# Patient Record
Sex: Female | Born: 1965 | Race: Black or African American | Hispanic: No | Marital: Single | Smoking: Never smoker
Health system: Southern US, Community
[De-identification: ages and names within clinical notes are randomized; demographics above are authoritative.]

## PROBLEM LIST (undated history)

## (undated) DIAGNOSIS — I10 Essential (primary) hypertension: Secondary | ICD-10-CM

## (undated) DIAGNOSIS — M109 Gout, unspecified: Secondary | ICD-10-CM

## (undated) DIAGNOSIS — M1990 Unspecified osteoarthritis, unspecified site: Secondary | ICD-10-CM

## (undated) DIAGNOSIS — E119 Type 2 diabetes mellitus without complications: Secondary | ICD-10-CM

## (undated) HISTORY — PX: SHOULDER SURGERY: SHX246

## (undated) HISTORY — DX: Gout, unspecified: M10.9

## (undated) HISTORY — PX: TOTAL ABDOMINAL HYSTERECTOMY: SHX209

## (undated) HISTORY — DX: Unspecified osteoarthritis, unspecified site: M19.90

---

## 2008-08-07 ENCOUNTER — Emergency Department (HOSPITAL_COMMUNITY): Admission: EM | Admit: 2008-08-07 | Discharge: 2008-08-07 | Payer: Self-pay | Admitting: Emergency Medicine

## 2015-05-13 DIAGNOSIS — D473 Essential (hemorrhagic) thrombocythemia: Secondary | ICD-10-CM

## 2015-05-13 DIAGNOSIS — D508 Other iron deficiency anemias: Secondary | ICD-10-CM

## 2015-06-14 ENCOUNTER — Emergency Department (HOSPITAL_COMMUNITY)
Admission: EM | Admit: 2015-06-14 | Discharge: 2015-06-14 | Disposition: A | Discharge status: Home / Self Care | Payer: Self-pay | Attending: Emergency Medicine | Admitting: Emergency Medicine

## 2015-06-14 ENCOUNTER — Emergency Department (HOSPITAL_COMMUNITY): Payer: Self-pay

## 2015-06-14 ENCOUNTER — Encounter (HOSPITAL_COMMUNITY): Payer: Self-pay | Admitting: Emergency Medicine

## 2015-06-14 DIAGNOSIS — Z794 Long term (current) use of insulin: Secondary | ICD-10-CM | POA: Insufficient documentation

## 2015-06-14 DIAGNOSIS — E119 Type 2 diabetes mellitus without complications: Secondary | ICD-10-CM | POA: Insufficient documentation

## 2015-06-14 DIAGNOSIS — L03115 Cellulitis of right lower limb: Secondary | ICD-10-CM | POA: Insufficient documentation

## 2015-06-14 DIAGNOSIS — L03031 Cellulitis of right toe: Secondary | ICD-10-CM

## 2015-06-14 DIAGNOSIS — I10 Essential (primary) hypertension: Secondary | ICD-10-CM | POA: Insufficient documentation

## 2015-06-14 DIAGNOSIS — Z91040 Latex allergy status: Secondary | ICD-10-CM | POA: Insufficient documentation

## 2015-06-14 DIAGNOSIS — M79675 Pain in left toe(s): Secondary | ICD-10-CM

## 2015-06-14 HISTORY — DX: Type 2 diabetes mellitus without complications: E11.9

## 2015-06-14 HISTORY — DX: Essential (primary) hypertension: I10

## 2015-06-14 MED ORDER — ACETAMINOPHEN 325 MG PO TABS
325.0000 mg | ORAL_TABLET | Freq: Once | ORAL | Status: AC
  Administered 2015-06-14: 325 mg via ORAL
  Filled 2015-06-14: qty 1

## 2015-06-14 MED ORDER — CEPHALEXIN 500 MG PO CAPS: 500.0000 mg | ORAL_CAPSULE | Freq: Four times a day (QID) | ORAL | Status: DC

## 2015-06-14 NOTE — ED Notes (Signed)
Pt here with hx of cellulitis on abd and still has draining area wants evaluated; pt c/o left foot pain with some swelling today

## 2015-06-14 NOTE — ED Notes (Signed)
Patient verbalized understanding of discharge instructions and denies any further needs or questions at this time. VS stable. Patient ambulatory with steady gait.  

## 2015-06-14 NOTE — Discharge Instructions (Signed)

## 2015-06-14 NOTE — ED Provider Notes (Signed)
CSN: [id number]     Arrival date & time 06/14/15  1602 History   First MD Initiated Contact with Patient 06/14/15 2217     Chief Complaint  Patient presents with  . Wound Infection    HPI   50 year old female presents today with complaints of redness to her left foot. Patient reports that she has a significant past medical history of cellulitis with a healing wound to her left abdomen. She reports that she is being followed by wound management for this, was seen by them yesterday and has a is a with them scheduled for tomorrow. She denies any active cellulitis to that site.  Patient reports that she woke up this morning and noticed an area of redness to her left foot at the second MTP. She reports that this is painful to palpation, painful with flexion extension of the toes and painful with ambulation. She denies any warmth to touch, fluctuance. She does have a history of gout on the right foot, reports this is less severe than the gout. She denies any history of trauma to the foot. Denies fever, chills, nausea, vomiting, dizziness, or any other concerning signs or symptoms. Patient is not currently taking any antibiotics.  Past Medical History  Diagnosis Date  . Hypertension   . Diabetes mellitus without complication ([place])    History reviewed. No pertinent past surgical history. History reviewed. No pertinent family history. Social History  Substance Use Topics  . Smoking status: Never Smoker   . Smokeless tobacco: None  . Alcohol Use: No   OB History    No data available     Review of Systems  All other systems reviewed and are negative.   Allergies  Latex  Home Medications   Prior to Admission medications   Medication Sig Start Date End Date Taking? Authorizing Provider  insulin aspart (NOVOLOG) 100 UNIT/ML injection Inject 5-6 Units into the skin 3 (three) times daily before meals. Per sliding scale   Yes Historical Provider, MD  insulin detemir (LEVEMIR) 100 UNIT/ML  injection Inject 18 Units into the skin at bedtime.   Yes Historical Provider, MD  losartan-hydrochlorothiazide (HYZAAR) 100-25 MG tablet Take 1 tablet by mouth daily.   Yes Historical Provider, MD  cephALEXin (KEFLEX) 500 MG capsule Take 1 capsule (500 mg total) by mouth 4 (four) times daily. 06/14/15   Ilo Beamon, PA-C   BP 134/103 mmHg  Pulse 90  Temp(Src) 98.5 F (36.9 C) (Oral)  Resp 16  SpO2 98%   Physical Exam  Constitutional: She is oriented to person, place, and time. She appears well-developed and well-nourished.  HENT:  Head: Normocephalic and atraumatic.  Eyes: Conjunctivae are normal. Pupils are equal, round, and reactive to light. Right eye exhibits no discharge. Left eye exhibits no discharge. No scleral icterus.  Neck: Normal range of motion. No JVD present. No tracheal deviation present.  Pulmonary/Chest: Effort normal. No stridor.  Abdominal:  Healing wound to the left abdomen covered by a dressing. Removal of dressing showed open wound, no surrounding cellulitis. No tenderness to palpation of surrounding soft tissue  Neurological: She is alert and oriented to person, place, and time. Coordination normal.  Psychiatric: She has a normal mood and affect. Her behavior is normal. Judgment and thought content normal.  Nursing note and vitals reviewed.  No warmth to touch, full active range of motion of the joint him and no involvement of the plantar surface     ED Course  Procedures (including critical care time)  Labs Review Labs Reviewed - No data to display  Imaging Review Dg Foot Complete Left  06/14/2015  CLINICAL DATA:  Left foot pain and swelling beginning last night centered about the second and third metatarsals. No known injury. Initial encounter. EXAM: LEFT FOOT - COMPLETE 3+ VIEW COMPARISON:  None. FINDINGS: No acute bony or joint abnormality is identified. Joint spaces are maintained. No erosion or periostitis is seen. No soft tissue gas collection.  Small plantar calcaneal spur is noted. IMPRESSION: Negative exam. Electronically Signed   By: Inge Rise M.D.   On: 06/14/2015 17:02   I have personally reviewed and evaluated these images and lab results as part of my medical decision-making.   EKG Interpretation None      MDM   Final diagnoses:  Pain of toe of left foot  Cellulitis of toe of right foot    Labs:  Imaging:  Consults:  Therapeutics:  Discharge Meds:   Assessment/Plan: 50 year old female with a history of wound infection presents today with redness to her left foot. It is no warmth to touch, she has full range of motion of the toe but has pain. I have very low suspicion for a septic arthritis, this could be the beginning of cellulitic infection. Patient does have a history of gout, uncertain if this is the beginning of gout as this is less severe than her previous gout flares. In any event patient has a significant past medical history of infections that have resulted in chronic wounds, she is not currently taking antibiotics no antibiotics in the last month. She is afebrile, nontoxic with no signs of systemic involvement. She will be placed on Keflex, instructed to follow-up with her primary care provider tomorrow for reevaluation and further management. Patient is given strict return precautions, verbalized understanding and agreement for today's plan.          Okey Regal, PA-C 06/15/15 0149  Harvel Quale, MD 06/22/15 2130

## 2015-07-04 ENCOUNTER — Encounter (HOSPITAL_BASED_OUTPATIENT_CLINIC_OR_DEPARTMENT_OTHER): Payer: Self-pay | Attending: Internal Medicine

## 2015-07-04 DIAGNOSIS — L98491 Non-pressure chronic ulcer of skin of other sites limited to breakdown of skin: Secondary | ICD-10-CM | POA: Insufficient documentation

## 2015-07-04 DIAGNOSIS — M109 Gout, unspecified: Secondary | ICD-10-CM | POA: Insufficient documentation

## 2015-07-04 DIAGNOSIS — E119 Type 2 diabetes mellitus without complications: Secondary | ICD-10-CM | POA: Insufficient documentation

## 2015-07-04 DIAGNOSIS — T8131XA Disruption of external operation (surgical) wound, not elsewhere classified, initial encounter: Secondary | ICD-10-CM | POA: Insufficient documentation

## 2015-07-04 DIAGNOSIS — Y838 Other surgical procedures as the cause of abnormal reaction of the patient, or of later complication, without mention of misadventure at the time of the procedure: Secondary | ICD-10-CM | POA: Insufficient documentation

## 2015-07-04 DIAGNOSIS — L02211 Cutaneous abscess of abdominal wall: Secondary | ICD-10-CM | POA: Insufficient documentation

## 2015-07-04 DIAGNOSIS — Z794 Long term (current) use of insulin: Secondary | ICD-10-CM | POA: Insufficient documentation

## 2015-07-11 ENCOUNTER — Other Ambulatory Visit: Payer: Self-pay | Admitting: Internal Medicine

## 2015-07-11 DIAGNOSIS — L98499 Non-pressure chronic ulcer of skin of other sites with unspecified severity: Secondary | ICD-10-CM

## 2015-07-11 DIAGNOSIS — L02211 Cutaneous abscess of abdominal wall: Secondary | ICD-10-CM

## 2015-07-11 DIAGNOSIS — T8131XS Disruption of external operation (surgical) wound, not elsewhere classified, sequela: Secondary | ICD-10-CM

## 2015-07-19 ENCOUNTER — Encounter (HOSPITAL_COMMUNITY): Payer: Self-pay

## 2015-07-19 ENCOUNTER — Ambulatory Visit (HOSPITAL_COMMUNITY)
Admission: RE | Admit: 2015-07-19 | Discharge: 2015-07-19 | Disposition: A | Discharge status: Home / Self Care | Payer: Self-pay | Source: Ambulatory Visit | Attending: Internal Medicine | Admitting: Internal Medicine

## 2015-07-19 ENCOUNTER — Other Ambulatory Visit: Payer: Self-pay | Admitting: Internal Medicine

## 2015-07-19 DIAGNOSIS — L98499 Non-pressure chronic ulcer of skin of other sites with unspecified severity: Secondary | ICD-10-CM

## 2015-07-19 DIAGNOSIS — S31109A Unspecified open wound of abdominal wall, unspecified quadrant without penetration into peritoneal cavity, initial encounter: Secondary | ICD-10-CM | POA: Insufficient documentation

## 2015-07-19 DIAGNOSIS — L02211 Cutaneous abscess of abdominal wall: Secondary | ICD-10-CM

## 2015-07-19 DIAGNOSIS — T8131XS Disruption of external operation (surgical) wound, not elsewhere classified, sequela: Secondary | ICD-10-CM

## 2015-07-19 MED ORDER — IOHEXOL 300 MG/ML  SOLN: 100.0000 mL | Freq: Once | INTRAMUSCULAR | Status: DC | PRN

## 2015-08-02 ENCOUNTER — Other Ambulatory Visit: Payer: Self-pay | Admitting: Surgery

## 2015-08-04 ENCOUNTER — Encounter (HOSPITAL_BASED_OUTPATIENT_CLINIC_OR_DEPARTMENT_OTHER): Payer: Self-pay | Attending: Internal Medicine

## 2015-08-04 DIAGNOSIS — Z794 Long term (current) use of insulin: Secondary | ICD-10-CM | POA: Insufficient documentation

## 2015-08-04 DIAGNOSIS — I10 Essential (primary) hypertension: Secondary | ICD-10-CM | POA: Insufficient documentation

## 2015-08-04 DIAGNOSIS — T8189XA Other complications of procedures, not elsewhere classified, initial encounter: Secondary | ICD-10-CM | POA: Insufficient documentation

## 2015-08-04 DIAGNOSIS — M109 Gout, unspecified: Secondary | ICD-10-CM | POA: Insufficient documentation

## 2015-08-04 DIAGNOSIS — Y838 Other surgical procedures as the cause of abnormal reaction of the patient, or of later complication, without mention of misadventure at the time of the procedure: Secondary | ICD-10-CM | POA: Insufficient documentation

## 2015-08-04 DIAGNOSIS — E119 Type 2 diabetes mellitus without complications: Secondary | ICD-10-CM | POA: Insufficient documentation

## 2015-08-04 DIAGNOSIS — L98492 Non-pressure chronic ulcer of skin of other sites with fat layer exposed: Secondary | ICD-10-CM | POA: Insufficient documentation

## 2015-08-11 DIAGNOSIS — D473 Essential (hemorrhagic) thrombocythemia: Secondary | ICD-10-CM

## 2015-09-01 ENCOUNTER — Encounter (HOSPITAL_BASED_OUTPATIENT_CLINIC_OR_DEPARTMENT_OTHER): Payer: Self-pay | Attending: Internal Medicine

## 2015-09-01 DIAGNOSIS — S31104A Unspecified open wound of abdominal wall, left lower quadrant without penetration into peritoneal cavity, initial encounter: Secondary | ICD-10-CM | POA: Insufficient documentation

## 2015-09-01 DIAGNOSIS — E119 Type 2 diabetes mellitus without complications: Secondary | ICD-10-CM | POA: Insufficient documentation

## 2015-09-01 DIAGNOSIS — I10 Essential (primary) hypertension: Secondary | ICD-10-CM | POA: Insufficient documentation

## 2015-09-01 DIAGNOSIS — Z794 Long term (current) use of insulin: Secondary | ICD-10-CM | POA: Insufficient documentation

## 2015-09-26 ENCOUNTER — Other Ambulatory Visit: Payer: Self-pay | Admitting: Surgery

## 2015-09-26 ENCOUNTER — Encounter (HOSPITAL_BASED_OUTPATIENT_CLINIC_OR_DEPARTMENT_OTHER): Payer: Self-pay | Attending: Internal Medicine

## 2015-09-26 DIAGNOSIS — E119 Type 2 diabetes mellitus without complications: Secondary | ICD-10-CM | POA: Insufficient documentation

## 2015-09-26 DIAGNOSIS — L98492 Non-pressure chronic ulcer of skin of other sites with fat layer exposed: Secondary | ICD-10-CM | POA: Insufficient documentation

## 2015-09-26 DIAGNOSIS — I10 Essential (primary) hypertension: Secondary | ICD-10-CM | POA: Insufficient documentation

## 2015-09-29 ENCOUNTER — Encounter (HOSPITAL_BASED_OUTPATIENT_CLINIC_OR_DEPARTMENT_OTHER): Payer: Self-pay

## 2015-10-27 ENCOUNTER — Encounter (HOSPITAL_BASED_OUTPATIENT_CLINIC_OR_DEPARTMENT_OTHER): Payer: Self-pay | Attending: Internal Medicine

## 2015-10-27 DIAGNOSIS — L02211 Cutaneous abscess of abdominal wall: Secondary | ICD-10-CM | POA: Insufficient documentation

## 2015-10-27 DIAGNOSIS — Z794 Long term (current) use of insulin: Secondary | ICD-10-CM | POA: Insufficient documentation

## 2015-10-27 DIAGNOSIS — R110 Nausea: Secondary | ICD-10-CM | POA: Insufficient documentation

## 2015-10-27 DIAGNOSIS — E119 Type 2 diabetes mellitus without complications: Secondary | ICD-10-CM | POA: Insufficient documentation

## 2015-10-27 DIAGNOSIS — T8131XA Disruption of external operation (surgical) wound, not elsewhere classified, initial encounter: Secondary | ICD-10-CM | POA: Insufficient documentation

## 2015-10-27 DIAGNOSIS — R51 Headache: Secondary | ICD-10-CM | POA: Insufficient documentation

## 2015-10-27 DIAGNOSIS — L98492 Non-pressure chronic ulcer of skin of other sites with fat layer exposed: Secondary | ICD-10-CM | POA: Insufficient documentation

## 2015-10-27 DIAGNOSIS — M109 Gout, unspecified: Secondary | ICD-10-CM | POA: Insufficient documentation

## 2015-10-27 DIAGNOSIS — M25532 Pain in left wrist: Secondary | ICD-10-CM | POA: Insufficient documentation

## 2015-10-27 DIAGNOSIS — I10 Essential (primary) hypertension: Secondary | ICD-10-CM | POA: Insufficient documentation

## 2015-10-27 DIAGNOSIS — Y838 Other surgical procedures as the cause of abnormal reaction of the patient, or of later complication, without mention of misadventure at the time of the procedure: Secondary | ICD-10-CM | POA: Insufficient documentation

## 2015-11-03 ENCOUNTER — Other Ambulatory Visit: Payer: Self-pay | Admitting: Internal Medicine

## 2015-11-04 ENCOUNTER — Other Ambulatory Visit: Payer: Self-pay | Admitting: Internal Medicine

## 2015-11-04 DIAGNOSIS — R509 Fever, unspecified: Secondary | ICD-10-CM

## 2015-11-04 DIAGNOSIS — R110 Nausea: Secondary | ICD-10-CM

## 2015-11-09 ENCOUNTER — Ambulatory Visit (HOSPITAL_COMMUNITY)
Admission: RE | Admit: 2015-11-09 | Discharge: 2015-11-09 | Disposition: A | Discharge status: Home / Self Care | Payer: Self-pay | Source: Ambulatory Visit | Attending: Internal Medicine | Admitting: Internal Medicine

## 2015-11-09 DIAGNOSIS — R932 Abnormal findings on diagnostic imaging of liver and biliary tract: Secondary | ICD-10-CM | POA: Insufficient documentation

## 2015-11-09 DIAGNOSIS — R110 Nausea: Secondary | ICD-10-CM | POA: Insufficient documentation

## 2015-11-09 DIAGNOSIS — R509 Fever, unspecified: Secondary | ICD-10-CM | POA: Insufficient documentation

## 2015-11-09 DIAGNOSIS — R140 Abdominal distension (gaseous): Secondary | ICD-10-CM | POA: Insufficient documentation

## 2016-01-02 LAB — HM COLONOSCOPY

## 2016-11-16 DIAGNOSIS — I10 Essential (primary) hypertension: Secondary | ICD-10-CM | POA: Insufficient documentation

## 2016-11-16 DIAGNOSIS — Z87898 Personal history of other specified conditions: Secondary | ICD-10-CM | POA: Insufficient documentation

## 2016-11-16 DIAGNOSIS — E119 Type 2 diabetes mellitus without complications: Secondary | ICD-10-CM | POA: Insufficient documentation

## 2017-01-09 IMAGING — CT CT ABDOMEN W/O CM
2 of 4 series · 16 of 46 positions shown, 18 images · non-contrast
Comparison: CT abdomen pelvis of 05/19/2015

CLINICAL DATA: Evaluate left upper abdomen abscess

EXAM:
CT ABDOMEN WITHOUT CONTRAST
TECHNIQUE: Multidetector CT imaging of the abdomen was performed following the
standard protocol without IV contrast.

[Series 2: rtn a/p w/o · axial · non-contrast · 0.90mm/px · z∈[-410,-150]mm · 13 of 58 slices shown, 15 images]
[im 3/58  soft-tissue]
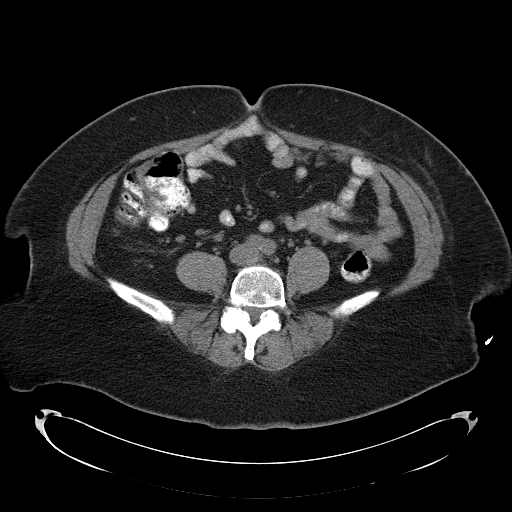
[im 3/58  bone]
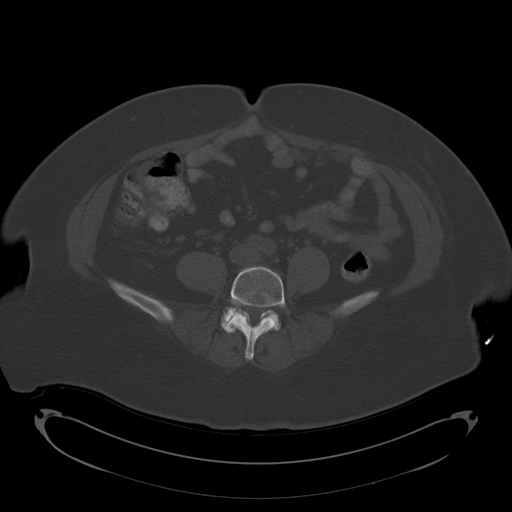
[im 9/58  soft-tissue]
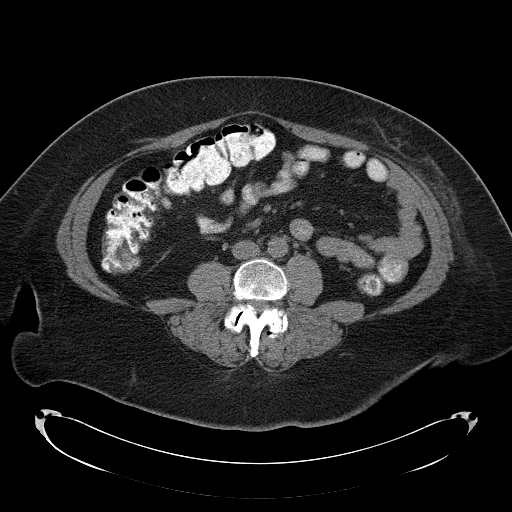
[im 11/58  soft-tissue]
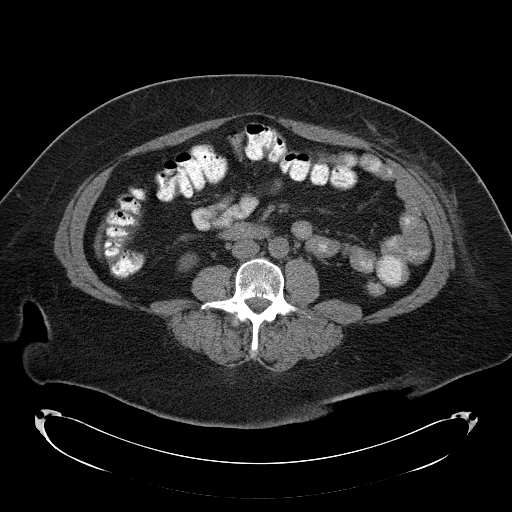
[im 17/58  soft-tissue]
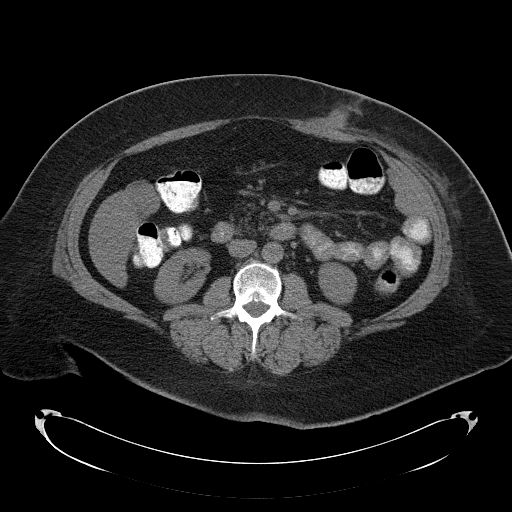
[im 20/58  soft-tissue]
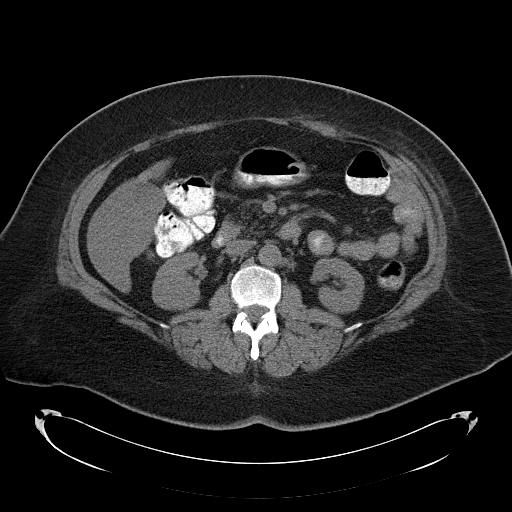
[im 25/58  soft-tissue]
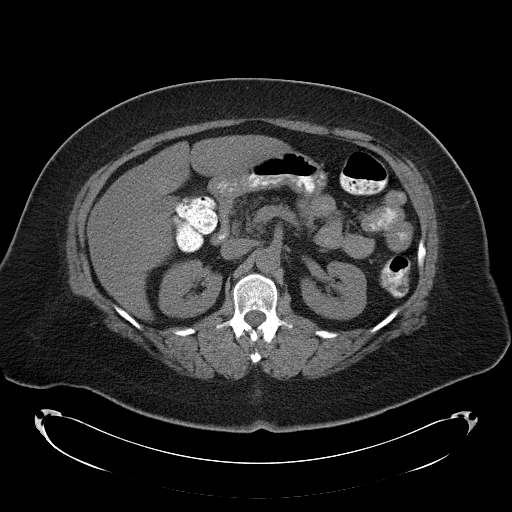
[im 30/58  soft-tissue]
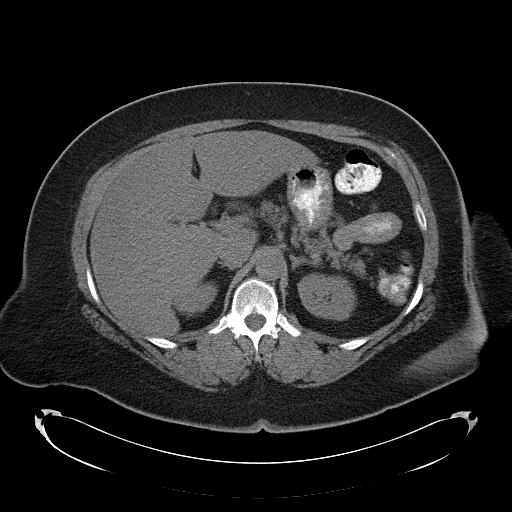
[im 33/58  soft-tissue]
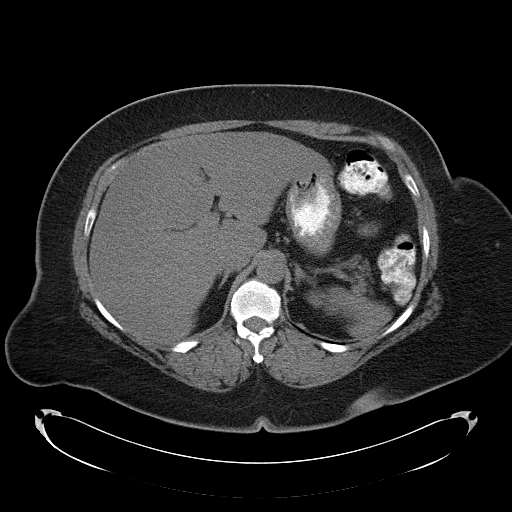
[im 39/58  soft-tissue]
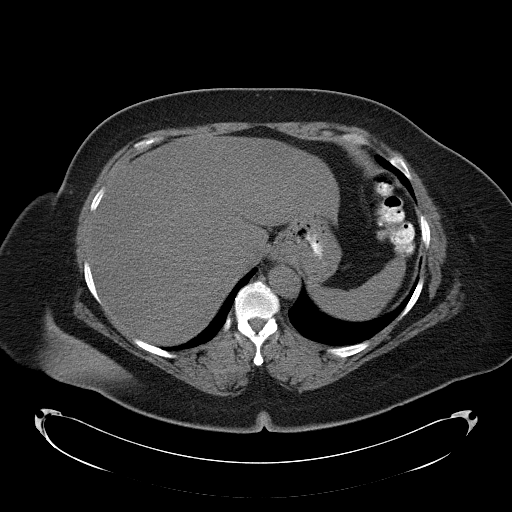
[im 39/58  bone]
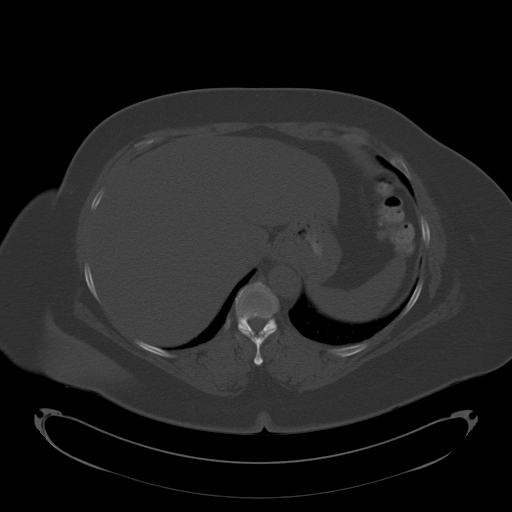
[im 41/58  soft-tissue]
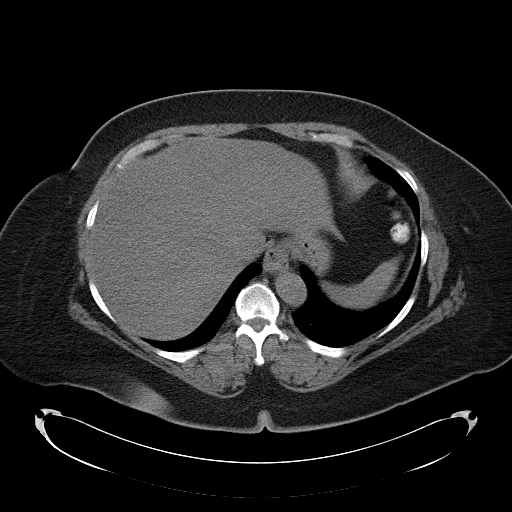
[im 47/58  soft-tissue]
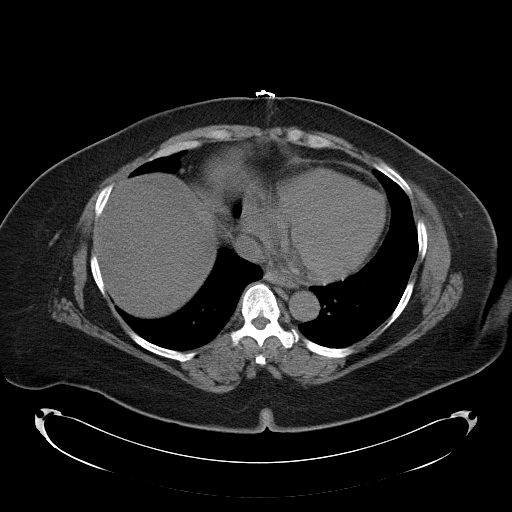
[im 49/58  soft-tissue]
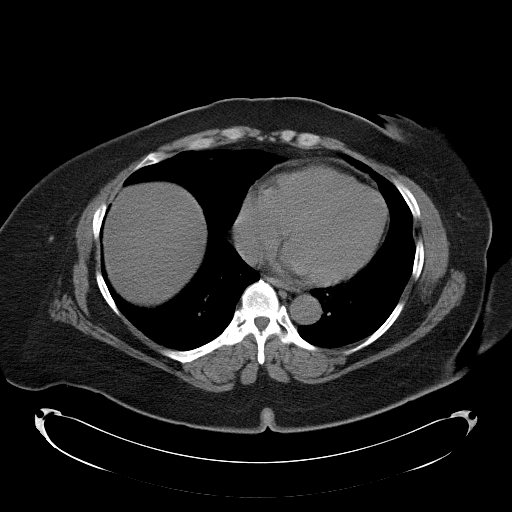
[im 55/58  soft-tissue]
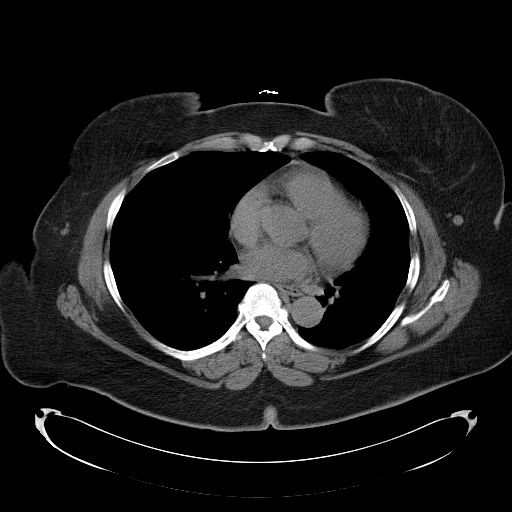

[Series 602: <mpr thick range> · coronal · 0.90mm/px · 3 of 98 slices shown]
[im 33/98  soft-tissue]
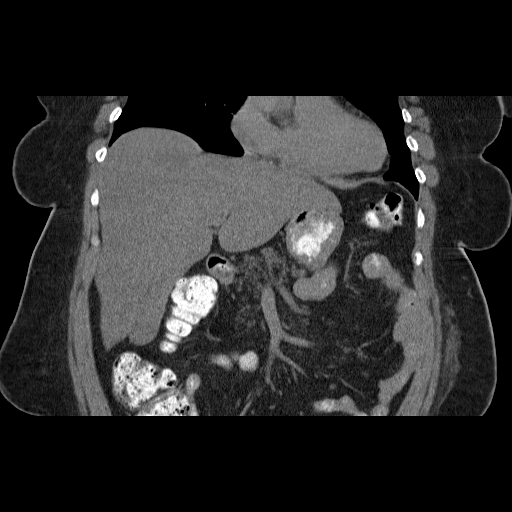
[im 44/98  soft-tissue]
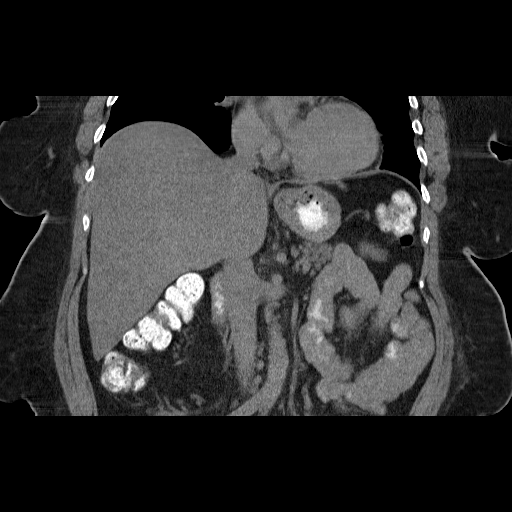
[im 54/98  soft-tissue]
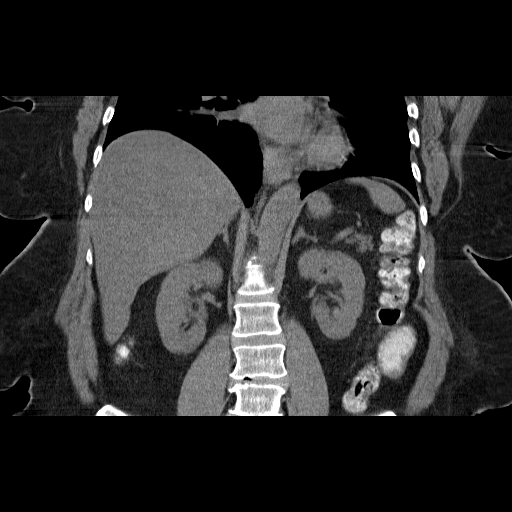

[16 of 46 positions shown; findings below may reference images not displayed]

FINDINGS: On the lung window images, there is a 6 mm noncalcified nodule in
the right middle lobe unchanged compared to the CT from 05/19/2015
and not included on the CT of 04/21/2015. If the patient is at high
risk for bronchogenic carcinoma, follow-up chest CT at 6-12 months
is recommended. If the patient is at low risk for bronchogenic
carcinoma, follow-up chest CT at 12 months is recommended. This
recommendation follows the consensus statement: Guidelines for
Management of Small Pulmonary Nodules Detected on CT Scans: A
Statement from the [HOSPITAL] as published in Radiology
7008;[DATE].

The liver is low in attenuation suggesting diffuse fatty
infiltration. There does appear to be some sparing near the
gallbladder. The gallbladder is somewhat contracted and no calcified
gallstones are seen. There is somewhat higher attenuation however
within the gallbladder which may indicate gallbladder sludge or
noncalcified gallstones. The pancreas is fatty infiltrated
diffusely. The pancreatic duct is not dilated. The adrenal glands
and spleen are unremarkable. The stomach is decompressed. The
kidneys are unremarkable in the unenhanced state. No renal calculi,
mass, or hydronephrosis is seen. The abdominal aorta is normal in
caliber.

The subcutaneous strandiness noted on the prior scan within the left
abdomen has improved significantly. The open wound still contains a
small amount air, but no abscess is seen and the surrounding
inflammatory response appears to have improved in the interval. No
extension into the peritoneal cavity by this inflammatory process is
seen. The lumbar vertebrae are in normal alignment with degenerative
changes in the facet joints of the lower lumbar spine.
IMPRESSION: 1. Significant interval improvement in the inflammatory process
within the subcutaneous soft tissues of the left abdomen. No
discrete abscess is seen.
2. 6 mm noncalcified nodule in the right middle lobe. Followup
recommendations are given above.
3. Suspect fatty infiltration of the liver.  Correlate clinically.

## 2017-01-11 LAB — HM COLONOSCOPY

## 2017-02-01 DIAGNOSIS — Z96611 Presence of right artificial shoulder joint: Secondary | ICD-10-CM | POA: Insufficient documentation

## 2017-02-01 DIAGNOSIS — M47816 Spondylosis without myelopathy or radiculopathy, lumbar region: Secondary | ICD-10-CM | POA: Insufficient documentation

## 2017-02-01 DIAGNOSIS — K219 Gastro-esophageal reflux disease without esophagitis: Secondary | ICD-10-CM | POA: Insufficient documentation

## 2017-02-01 DIAGNOSIS — M1A09X0 Idiopathic chronic gout, multiple sites, without tophus (tophi): Secondary | ICD-10-CM | POA: Insufficient documentation

## 2017-02-01 DIAGNOSIS — M150 Primary generalized (osteo)arthritis: Secondary | ICD-10-CM | POA: Insufficient documentation

## 2017-02-12 DIAGNOSIS — E782 Mixed hyperlipidemia: Secondary | ICD-10-CM | POA: Insufficient documentation

## 2017-10-12 IMAGING — US US ABDOMEN COMPLETE
1 series · 14 of 25 positions shown · non-contrast
Comparison: CT abdomen and pelvis of 07/19/2015

CLINICAL DATA: Abdominal pain, nausea, fever for 8 months, diabetes
and hypertension

EXAM:
ABDOMEN ULTRASOUND COMPLETE

[Series 1: us abdomen complete · 0.23mm/px · 14 of 96 slices shown]
[im 1/96]
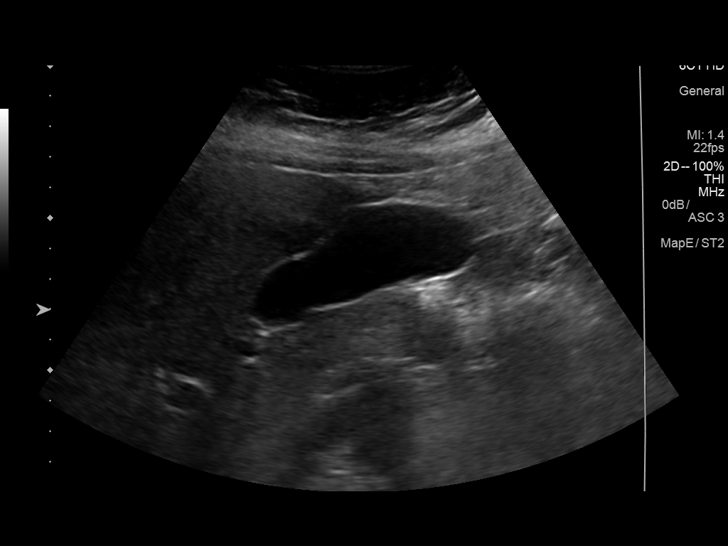
[im 8/96]
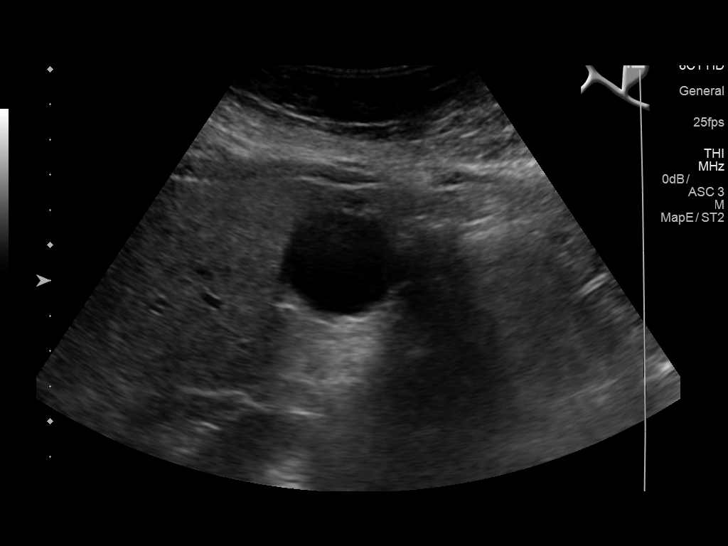
[im 16/96]
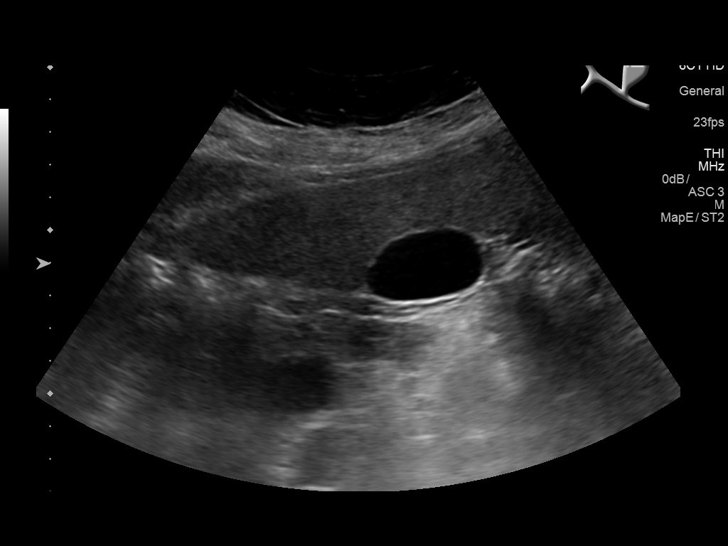
[im 24/96]
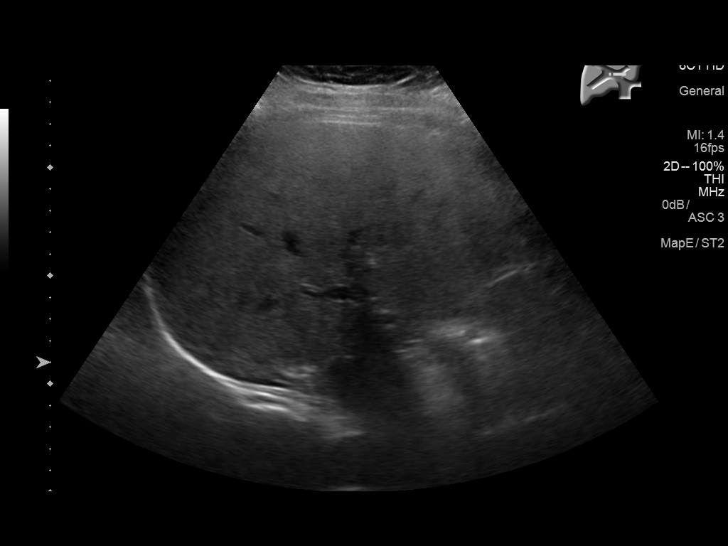
[im 32/96]
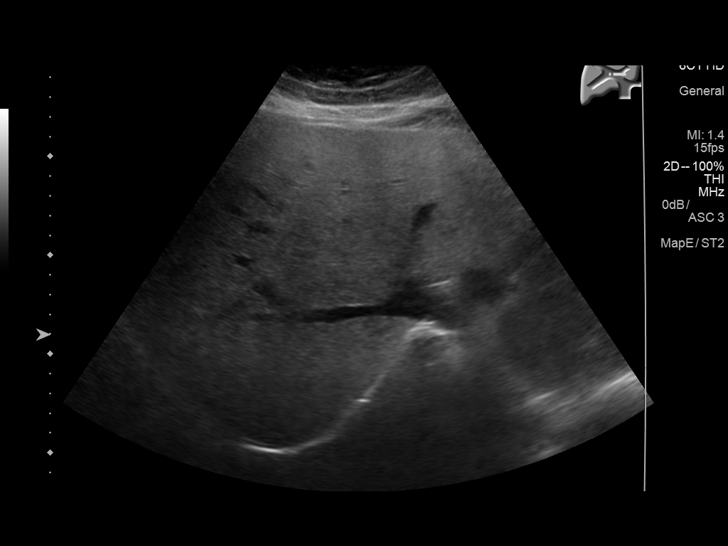
[im 36/96]
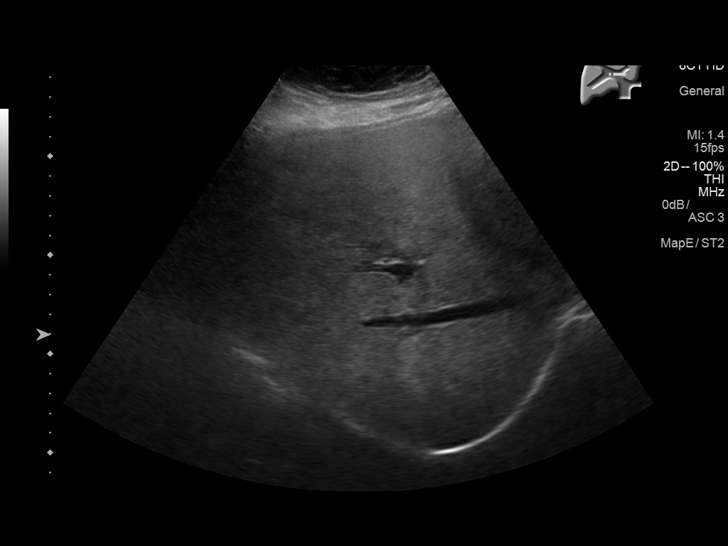
[im 44/96]
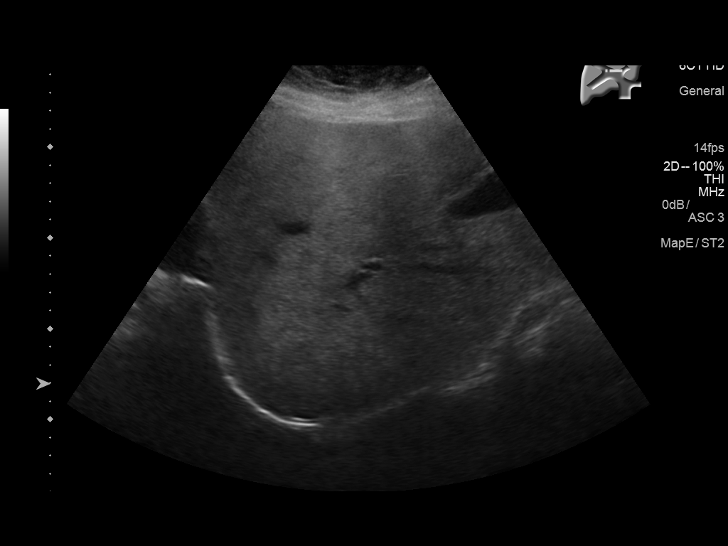
[im 52/96]
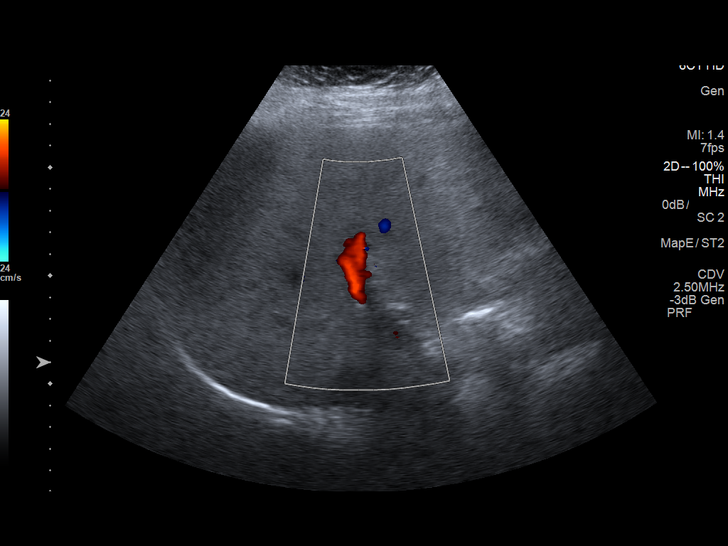
[im 60/96]
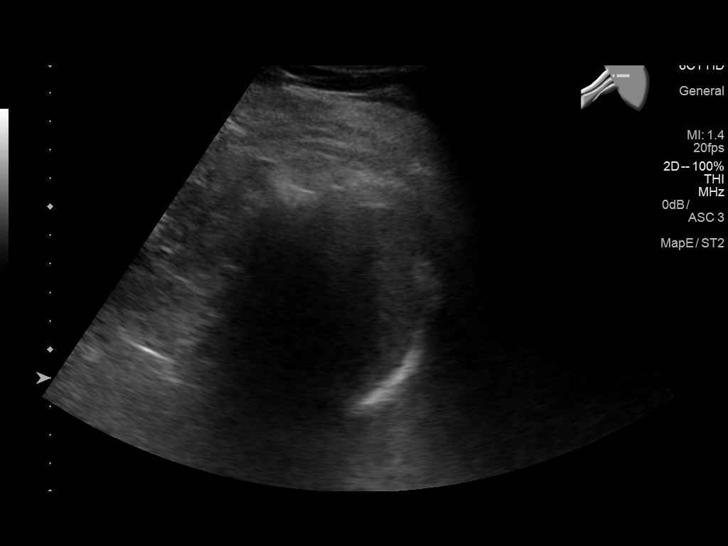
[im 64/96]
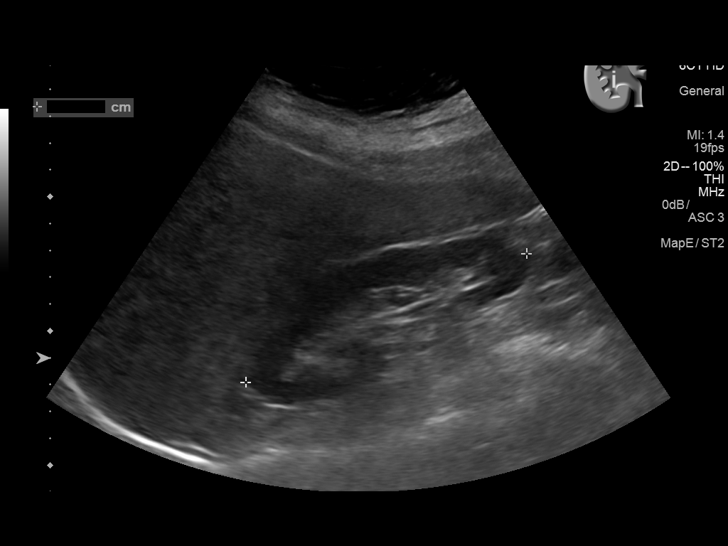
[im 72/96]
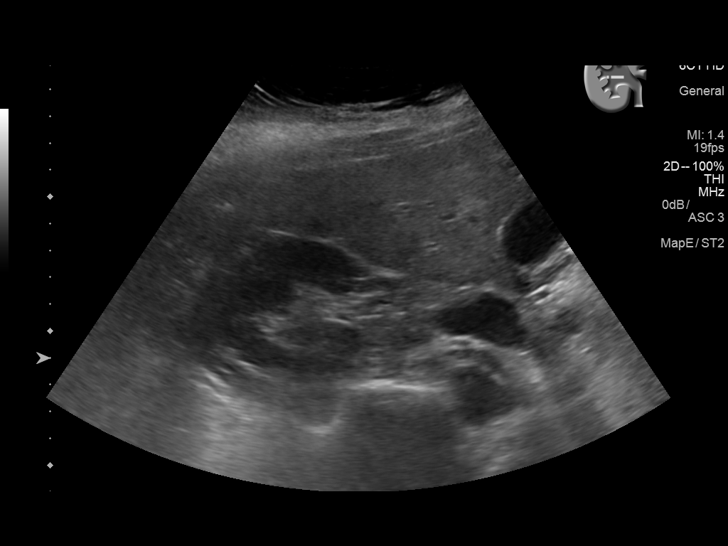
[im 80/96]
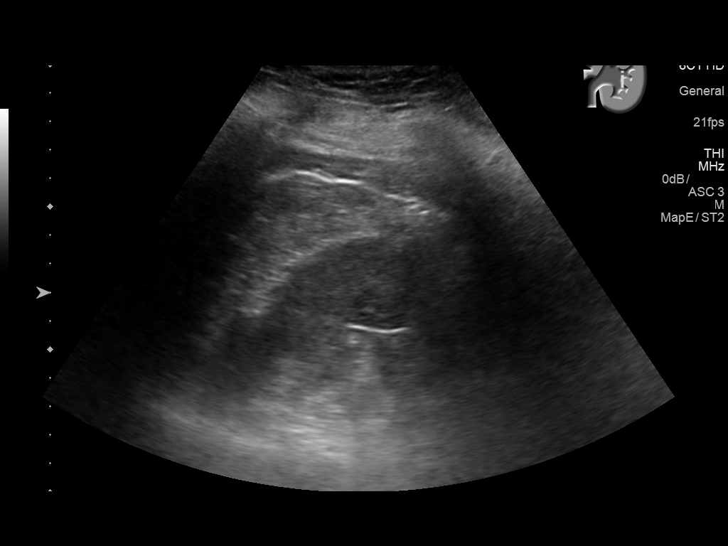
[im 88/96]
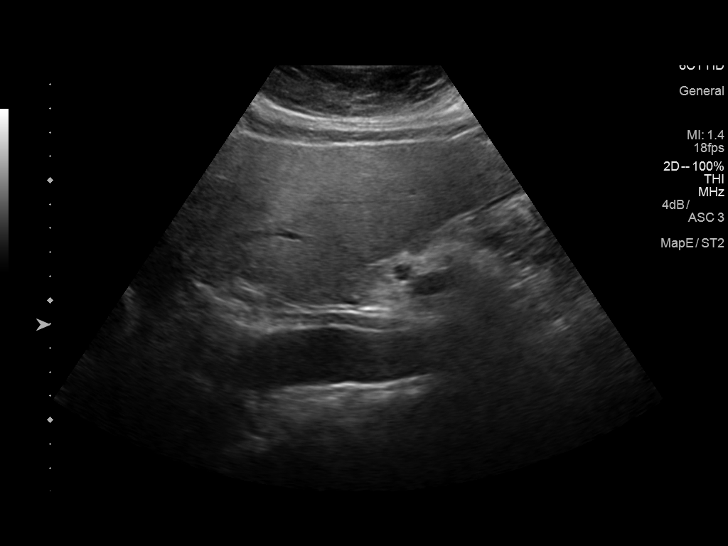
[im 96/96]
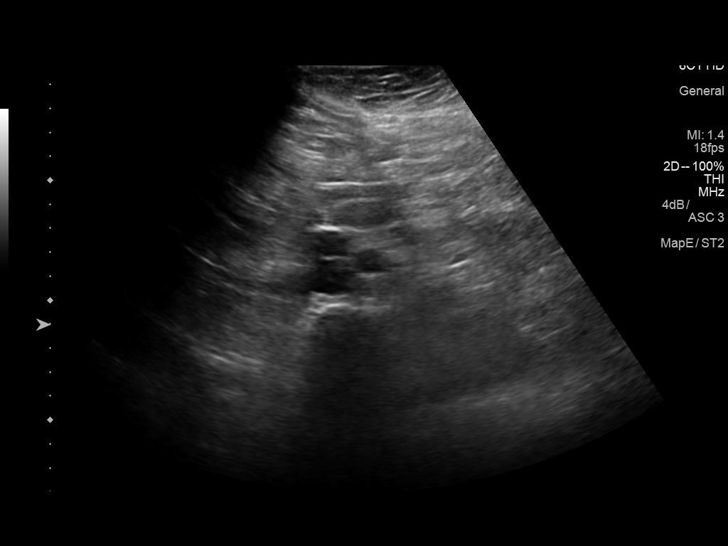

[14 of 25 positions shown; findings below may reference images not displayed]

FINDINGS: Gallbladder: The gallbladder is visualized and no gallstones are
noted. There is no pain over the gallbladder with compression

Common bile duct: Diameter: The common bile duct is normal measuring
2.9 mm in diameter.

Liver: The liver is diffusely echogenic consistent with fatty
infiltration. No focal hepatic abnormality is seen.

IVC: No abnormality visualized.

Pancreas: Only the midportion of the pancreas is visualized. The
head and tail of the pancreas are obscured by bowel gas.

Spleen: The spleen is partially obscured by bowel gas with the best
measurement being 3.0 cm.

Right Kidney: Length: 11.5 cm..  No hydronephrosis is seen.

Left Kidney: Length: 10.5 cm..  No hydronephrosis is noted.

Abdominal aorta: 2.8 cm.

Other findings: None.
IMPRESSION: 1. No gallstones.
2. Liver is diffusely echogenic consistent with fatty infiltration.
No focal hepatic abnormality is seen.
3. Much of the pancreas and spleen is obscured by bowel gas.

## 2018-04-11 DIAGNOSIS — N183 Chronic kidney disease, stage 3 unspecified: Secondary | ICD-10-CM | POA: Insufficient documentation

## 2018-06-29 ENCOUNTER — Other Ambulatory Visit: Payer: Self-pay

## 2018-06-29 ENCOUNTER — Emergency Department (HOSPITAL_COMMUNITY)
Admission: EM | Admit: 2018-06-29 | Discharge: 2018-06-29 | Disposition: A | Discharge status: Home / Self Care | Payer: Medicare Other | Attending: Emergency Medicine | Admitting: Emergency Medicine

## 2018-06-29 ENCOUNTER — Encounter (HOSPITAL_COMMUNITY): Payer: Self-pay | Admitting: Emergency Medicine

## 2018-06-29 DIAGNOSIS — Z794 Long term (current) use of insulin: Secondary | ICD-10-CM | POA: Diagnosis not present

## 2018-06-29 DIAGNOSIS — Z79899 Other long term (current) drug therapy: Secondary | ICD-10-CM | POA: Insufficient documentation

## 2018-06-29 DIAGNOSIS — I10 Essential (primary) hypertension: Secondary | ICD-10-CM | POA: Insufficient documentation

## 2018-06-29 DIAGNOSIS — E119 Type 2 diabetes mellitus without complications: Secondary | ICD-10-CM | POA: Diagnosis not present

## 2018-06-29 DIAGNOSIS — R52 Pain, unspecified: Secondary | ICD-10-CM | POA: Diagnosis present

## 2018-06-29 DIAGNOSIS — B349 Viral infection, unspecified: Secondary | ICD-10-CM | POA: Diagnosis not present

## 2018-06-29 LAB — BASIC METABOLIC PANEL
Anion gap: 11 (ref 5–15)
BUN: 14 mg/dL (ref 6–20)
CALCIUM: 9.7 mg/dL (ref 8.9–10.3)
CO2: 27 mmol/L (ref 22–32)
CREATININE: 1.08 mg/dL — AB (ref 0.44–1.00)
Chloride: 100 mmol/L (ref 98–111)
GFR calc Af Amer: >60 mL/min (ref >60)
GFR, EST NON AFRICAN AMERICAN: 58 mL/min — AB (ref >60)
GLUCOSE: 128 mg/dL — AB (ref 70–99)
Potassium: 3.8 mmol/L (ref 3.5–5.1)
SODIUM: 138 mmol/L (ref 135–145)

## 2018-06-29 LAB — CBC WITH DIFFERENTIAL/PLATELET
ABS IMMATURE GRANULOCYTES: 0.02 10*3/uL (ref 0.00–0.07)
BASOS PCT: 1 %
Basophils Absolute: 0.1 10*3/uL (ref 0.0–0.1)
EOS ABS: 0.1 10*3/uL (ref 0.0–0.5)
EOS PCT: 1 %
HCT: 40.3 % (ref 36.0–46.0)
Hemoglobin: 12.3 g/dL (ref 12.0–15.0)
Immature Granulocytes: 0 %
Lymphocytes Relative: 33 %
Lymphs Abs: 3.6 10*3/uL (ref 0.7–4.0)
MCH: 24.7 pg — AB (ref 26.0–34.0)
MCHC: 30.5 g/dL (ref 30.0–36.0)
MCV: 80.9 fL (ref 80.0–100.0)
MONO ABS: 0.6 10*3/uL (ref 0.1–1.0)
MONOS PCT: 6 %
Neutro Abs: 6.5 10*3/uL (ref 1.7–7.7)
Neutrophils Relative %: 59 %
PLATELETS: 387 10*3/uL (ref 150–400)
RBC: 4.98 MIL/uL (ref 3.87–5.11)
RDW: 15.5 % (ref 11.5–15.5)
WBC: 10.9 10*3/uL — AB (ref 4.0–10.5)
nRBC: 0 % (ref 0.0–0.2)

## 2018-06-29 NOTE — ED Provider Notes (Signed)
[facility] EMERGENCY DEPARTMENT Provider Note   CSN: [id number] Arrival date & time: 06/29/18  2836     History   Chief Complaint Chief Complaint  Patient presents with  . Generalized Body Aches    HPI Elaine Blair is a 53 y.o. female with past medical history of hypertension, type 2 diabetes, presenting to the emergency department with complaint of 1 week of body aches.  She states they began to improve, however on Thursday worsened again.  States she has associated chills and fever with T-max of 101.3 F.  Last dose Tylenol was last night.  Patient reports left-sided abdominal pain with episodes of diarrhea, last episode was yesterday.  She has nausea without vomiting.  She also has a mild sore throat.  She states she has been having issues with recurrent abdominal infections in the left abdomen, followed by Dr. Raechel Chute with [facility].  She states she is seen multiple specialist, include infectious disease who is unsure of the reasoning behind her recurrent infections.  Per chart review of Dr. Feliz Beam note from patient's last visit, patient with recurrent fever and suspected recurrent cellulitis of the left abdomen.  Patient states not had any antibiotics for this since November 2019.  Reports to the ED today with hopes of finding the reason behind her recurrent episodes.  The history is provided by the patient and medical records.    Past Medical History:  Diagnosis Date  . Diabetes mellitus without complication ([place])   . Hypertension     There are no active problems to display for this patient.   History reviewed. No pertinent surgical history.   OB History   No obstetric history on file.      Home Medications    Prior to Admission medications   Medication Sig Start Date End Date Taking? Authorizing Provider  cephALEXin (KEFLEX) 500 MG capsule Take 1 capsule (500 mg total) by mouth 4 (four) times daily. 06/14/15   Hedges, Dellis Filbert, PA-C  insulin  aspart (NOVOLOG) 100 UNIT/ML injection Inject 5-6 Units into the skin 3 (three) times daily before meals. Per sliding scale    [provider]  insulin detemir (LEVEMIR) 100 UNIT/ML injection Inject 18 Units into the skin at bedtime.    [provider]  losartan-hydrochlorothiazide (HYZAAR) 100-25 MG tablet Take 1 tablet by mouth daily.    [provider]    Family History No family history on file.  Social History Social History   Tobacco Use  . Smoking status: Never Smoker  . Smokeless tobacco: Never Used  Substance Use Topics  . Alcohol use: No  . Drug use: No     Allergies   Latex   Review of Systems Review of Systems  Constitutional: Positive for chills and fever.  Gastrointestinal: Positive for abdominal pain, diarrhea and nausea. Negative for vomiting.  Musculoskeletal: Positive for myalgias (generalized).  All other systems reviewed and are negative.    Physical Exam Updated Vital Signs BP (!) 134/103   Pulse 92   Temp 98.2 F (36.8 C) (Oral)   Resp 16   SpO2 98%   Physical Exam Vitals signs and nursing note reviewed.  Constitutional:      General: She is not in acute distress.    Appearance: She is well-developed. She is obese. She is not ill-appearing or toxic-appearing.  HENT:     Head: Normocephalic and atraumatic.     Mouth/Throat:     Mouth: Mucous membranes are moist.  Pharynx: Oropharynx is clear.  Eyes:     Conjunctiva/sclera: Conjunctivae normal.  Neck:     Musculoskeletal: Normal range of motion. No neck rigidity.  Cardiovascular:     Rate and Rhythm: Normal rate and regular rhythm.  Pulmonary:     Effort: Pulmonary effort is normal.     Breath sounds: Normal breath sounds.  Abdominal:     General: Bowel sounds are normal. There is no distension.     Palpations: Abdomen is soft.     Tenderness: There is abdominal tenderness. There is no guarding or rebound.     Comments: Generalized mild tenderness to  left abdomen. No erythema or warmth. No edema.   Lymphadenopathy:     Cervical: No cervical adenopathy.  Skin:    General: Skin is warm.  Neurological:     Mental Status: She is alert.  Psychiatric:        Behavior: Behavior normal.      ED Treatments / Results  Labs (all labs ordered are listed, but only abnormal results are displayed) Labs Reviewed  BASIC METABOLIC PANEL - Abnormal; Notable for the following components:      Result Value   Glucose, Bld 128 (*)    Creatinine, Ser 1.08 (*)    GFR calc non Af Amer 58 (*)    All other components within normal limits  CBC WITH DIFFERENTIAL/PLATELET - Abnormal; Notable for the following components:   WBC 10.9 (*)    MCH 24.7 (*)    All other components within normal limits    EKG None  Radiology No results found.  Procedures Procedures (including critical care time)  Medications Ordered in ED Medications - No data to display   Initial Impression / Assessment and Plan / ED Course  I have reviewed the triage vital signs and the nursing notes.  Pertinent labs & imaging results that were available during my care of the patient were reviewed by me and considered in my medical decision making (see chart for details).     Patient presenting with fever, chills, body aches, sore throat, abdominal pain, 24 hours of diarrhea.  Suspect possible viral etiology of symptoms.  Patient requesting labs, however did not feel they were necessary.  Patient discussed with Dr. Jeanell Sparrow.  Labs reveal improving leukocytosis from last draw in November 2019.  Creatinine improved from last draw on August 2019.  Overall exam is very reassuring.  Abdominal tenderness is nonfocal.  No skin changes to suggest cellulitis or infection for which patient has recurrent issues with.  No fever.  Lungs clear.  Discussed symptomatic management and follow-up with PCP.  Do not believe antibiotics are indicated at this time.  Patient agreeable to plan and safe for  discharge.  Discussed results, findings, treatment and follow up. Patient advised of return precautions. Patient verbalized understanding and agreed with plan.  Final Clinical Impressions(s) / ED Diagnoses   Final diagnoses:  Viral illness    ED Discharge Orders    None       Kevante Lunt, [place] N, PA-C 06/29/18 1326    Pattricia Boss, MD 06/29/18 [phone]

## 2018-06-29 NOTE — Discharge Instructions (Signed)
You can take Tylenol every 4-6 hours as needed for body aches, fevers. Call your primary care doctor tomorrow to discuss your visit today and schedule follow-up appointment.

## 2018-06-29 NOTE — ED Triage Notes (Signed)
Pt. Stated, Ive had body aches and chills since Thursday. Im also having night sweats

## 2018-06-29 NOTE — ED Notes (Signed)
Patient verbalizes understanding of discharge instructions. Opportunity for questioning and answers were provided. Armband removed by staff, pt discharged from ED. Pt ambulatory to lobby.  

## 2018-08-07 DIAGNOSIS — Z90710 Acquired absence of both cervix and uterus: Secondary | ICD-10-CM | POA: Insufficient documentation

## 2018-11-24 DIAGNOSIS — I151 Hypertension secondary to other renal disorders: Secondary | ICD-10-CM | POA: Insufficient documentation

## 2021-03-29 DIAGNOSIS — Z794 Long term (current) use of insulin: Secondary | ICD-10-CM | POA: Diagnosis not present

## 2021-03-29 DIAGNOSIS — E119 Type 2 diabetes mellitus without complications: Secondary | ICD-10-CM | POA: Diagnosis not present

## 2021-04-12 DIAGNOSIS — Z794 Long term (current) use of insulin: Secondary | ICD-10-CM | POA: Diagnosis not present

## 2021-04-12 DIAGNOSIS — E119 Type 2 diabetes mellitus without complications: Secondary | ICD-10-CM | POA: Diagnosis not present

## 2021-05-01 DIAGNOSIS — E119 Type 2 diabetes mellitus without complications: Secondary | ICD-10-CM | POA: Diagnosis not present

## 2021-05-01 DIAGNOSIS — Z794 Long term (current) use of insulin: Secondary | ICD-10-CM | POA: Diagnosis not present

## 2021-06-01 DIAGNOSIS — Z794 Long term (current) use of insulin: Secondary | ICD-10-CM | POA: Diagnosis not present

## 2021-06-01 DIAGNOSIS — E119 Type 2 diabetes mellitus without complications: Secondary | ICD-10-CM | POA: Diagnosis not present

## 2021-06-19 DIAGNOSIS — M47816 Spondylosis without myelopathy or radiculopathy, lumbar region: Secondary | ICD-10-CM | POA: Diagnosis not present

## 2021-06-19 DIAGNOSIS — M170 Bilateral primary osteoarthritis of knee: Secondary | ICD-10-CM | POA: Diagnosis not present

## 2021-06-19 DIAGNOSIS — Z79899 Other long term (current) drug therapy: Secondary | ICD-10-CM | POA: Diagnosis not present

## 2021-06-19 DIAGNOSIS — G894 Chronic pain syndrome: Secondary | ICD-10-CM | POA: Diagnosis not present

## 2021-07-02 DIAGNOSIS — Z794 Long term (current) use of insulin: Secondary | ICD-10-CM | POA: Diagnosis not present

## 2021-07-02 DIAGNOSIS — E119 Type 2 diabetes mellitus without complications: Secondary | ICD-10-CM | POA: Diagnosis not present

## 2021-07-26 DIAGNOSIS — E119 Type 2 diabetes mellitus without complications: Secondary | ICD-10-CM | POA: Diagnosis not present

## 2021-07-26 DIAGNOSIS — Z794 Long term (current) use of insulin: Secondary | ICD-10-CM | POA: Diagnosis not present

## 2021-08-25 DIAGNOSIS — E119 Type 2 diabetes mellitus without complications: Secondary | ICD-10-CM | POA: Diagnosis not present

## 2021-08-25 DIAGNOSIS — Z794 Long term (current) use of insulin: Secondary | ICD-10-CM | POA: Diagnosis not present

## 2021-09-04 DIAGNOSIS — M170 Bilateral primary osteoarthritis of knee: Secondary | ICD-10-CM | POA: Diagnosis not present

## 2021-09-04 DIAGNOSIS — M47816 Spondylosis without myelopathy or radiculopathy, lumbar region: Secondary | ICD-10-CM | POA: Diagnosis not present

## 2021-09-04 DIAGNOSIS — G894 Chronic pain syndrome: Secondary | ICD-10-CM | POA: Diagnosis not present

## 2021-09-24 DIAGNOSIS — E119 Type 2 diabetes mellitus without complications: Secondary | ICD-10-CM | POA: Diagnosis not present

## 2021-09-24 DIAGNOSIS — Z794 Long term (current) use of insulin: Secondary | ICD-10-CM | POA: Diagnosis not present

## 2021-10-03 DIAGNOSIS — L739 Follicular disorder, unspecified: Secondary | ICD-10-CM | POA: Diagnosis not present

## 2021-10-04 DIAGNOSIS — E119 Type 2 diabetes mellitus without complications: Secondary | ICD-10-CM | POA: Diagnosis not present

## 2021-10-04 DIAGNOSIS — H524 Presbyopia: Secondary | ICD-10-CM | POA: Diagnosis not present

## 2021-10-04 DIAGNOSIS — H52223 Regular astigmatism, bilateral: Secondary | ICD-10-CM | POA: Diagnosis not present

## 2021-10-31 DIAGNOSIS — E782 Mixed hyperlipidemia: Secondary | ICD-10-CM | POA: Diagnosis not present

## 2021-10-31 DIAGNOSIS — G894 Chronic pain syndrome: Secondary | ICD-10-CM | POA: Diagnosis not present

## 2021-10-31 DIAGNOSIS — Z794 Long term (current) use of insulin: Secondary | ICD-10-CM | POA: Diagnosis not present

## 2021-10-31 DIAGNOSIS — M1A09X0 Idiopathic chronic gout, multiple sites, without tophus (tophi): Secondary | ICD-10-CM | POA: Diagnosis not present

## 2021-10-31 DIAGNOSIS — Z1231 Encounter for screening mammogram for malignant neoplasm of breast: Secondary | ICD-10-CM | POA: Diagnosis not present

## 2021-10-31 DIAGNOSIS — K219 Gastro-esophageal reflux disease without esophagitis: Secondary | ICD-10-CM | POA: Diagnosis not present

## 2021-10-31 DIAGNOSIS — R252 Cramp and spasm: Secondary | ICD-10-CM | POA: Diagnosis not present

## 2021-10-31 DIAGNOSIS — Z23 Encounter for immunization: Secondary | ICD-10-CM | POA: Diagnosis not present

## 2021-10-31 DIAGNOSIS — E119 Type 2 diabetes mellitus without complications: Secondary | ICD-10-CM | POA: Diagnosis not present

## 2021-10-31 DIAGNOSIS — E1122 Type 2 diabetes mellitus with diabetic chronic kidney disease: Secondary | ICD-10-CM | POA: Diagnosis not present

## 2021-10-31 DIAGNOSIS — R0602 Shortness of breath: Secondary | ICD-10-CM | POA: Diagnosis not present

## 2021-10-31 DIAGNOSIS — I151 Hypertension secondary to other renal disorders: Secondary | ICD-10-CM | POA: Diagnosis not present

## 2021-10-31 DIAGNOSIS — N1831 Chronic kidney disease, stage 3a: Secondary | ICD-10-CM | POA: Diagnosis not present

## 2021-10-31 DIAGNOSIS — M159 Polyosteoarthritis, unspecified: Secondary | ICD-10-CM | POA: Diagnosis not present

## 2021-10-31 DIAGNOSIS — Z0000 Encounter for general adult medical examination without abnormal findings: Secondary | ICD-10-CM | POA: Diagnosis not present

## 2021-11-06 DIAGNOSIS — Z794 Long term (current) use of insulin: Secondary | ICD-10-CM | POA: Diagnosis not present

## 2021-11-06 DIAGNOSIS — E119 Type 2 diabetes mellitus without complications: Secondary | ICD-10-CM | POA: Diagnosis not present

## 2021-11-30 DIAGNOSIS — M47816 Spondylosis without myelopathy or radiculopathy, lumbar region: Secondary | ICD-10-CM | POA: Diagnosis not present

## 2021-11-30 DIAGNOSIS — M170 Bilateral primary osteoarthritis of knee: Secondary | ICD-10-CM | POA: Diagnosis not present

## 2021-11-30 DIAGNOSIS — G894 Chronic pain syndrome: Secondary | ICD-10-CM | POA: Diagnosis not present

## 2021-12-06 DIAGNOSIS — Z794 Long term (current) use of insulin: Secondary | ICD-10-CM | POA: Diagnosis not present

## 2021-12-06 DIAGNOSIS — E119 Type 2 diabetes mellitus without complications: Secondary | ICD-10-CM | POA: Diagnosis not present

## 2022-01-05 DIAGNOSIS — Z794 Long term (current) use of insulin: Secondary | ICD-10-CM | POA: Diagnosis not present

## 2022-01-05 DIAGNOSIS — E119 Type 2 diabetes mellitus without complications: Secondary | ICD-10-CM | POA: Diagnosis not present

## 2022-02-05 DIAGNOSIS — Z794 Long term (current) use of insulin: Secondary | ICD-10-CM | POA: Diagnosis not present

## 2022-02-05 DIAGNOSIS — E119 Type 2 diabetes mellitus without complications: Secondary | ICD-10-CM | POA: Diagnosis not present

## 2022-03-07 DIAGNOSIS — E119 Type 2 diabetes mellitus without complications: Secondary | ICD-10-CM | POA: Diagnosis not present

## 2022-03-07 DIAGNOSIS — Z794 Long term (current) use of insulin: Secondary | ICD-10-CM | POA: Diagnosis not present

## 2022-03-12 DIAGNOSIS — M47816 Spondylosis without myelopathy or radiculopathy, lumbar region: Secondary | ICD-10-CM | POA: Diagnosis not present

## 2022-03-12 DIAGNOSIS — M2578 Osteophyte, vertebrae: Secondary | ICD-10-CM | POA: Diagnosis not present

## 2022-03-12 DIAGNOSIS — G894 Chronic pain syndrome: Secondary | ICD-10-CM | POA: Diagnosis not present

## 2022-03-12 DIAGNOSIS — G8929 Other chronic pain: Secondary | ICD-10-CM | POA: Diagnosis not present

## 2022-03-12 DIAGNOSIS — M7061 Trochanteric bursitis, right hip: Secondary | ICD-10-CM | POA: Diagnosis not present

## 2022-03-12 DIAGNOSIS — M5416 Radiculopathy, lumbar region: Secondary | ICD-10-CM | POA: Diagnosis not present

## 2022-03-12 DIAGNOSIS — Z79899 Other long term (current) drug therapy: Secondary | ICD-10-CM | POA: Diagnosis not present

## 2022-03-12 DIAGNOSIS — M4316 Spondylolisthesis, lumbar region: Secondary | ICD-10-CM | POA: Diagnosis not present

## 2022-03-16 DIAGNOSIS — R110 Nausea: Secondary | ICD-10-CM | POA: Diagnosis not present

## 2022-03-16 DIAGNOSIS — E119 Type 2 diabetes mellitus without complications: Secondary | ICD-10-CM | POA: Diagnosis not present

## 2022-03-16 DIAGNOSIS — Z794 Long term (current) use of insulin: Secondary | ICD-10-CM | POA: Diagnosis not present

## 2022-03-16 DIAGNOSIS — R109 Unspecified abdominal pain: Secondary | ICD-10-CM | POA: Diagnosis not present

## 2022-03-16 DIAGNOSIS — D649 Anemia, unspecified: Secondary | ICD-10-CM | POA: Diagnosis not present

## 2022-03-16 DIAGNOSIS — Z23 Encounter for immunization: Secondary | ICD-10-CM | POA: Diagnosis not present

## 2022-03-20 DIAGNOSIS — D509 Iron deficiency anemia, unspecified: Secondary | ICD-10-CM | POA: Insufficient documentation

## 2022-04-06 DIAGNOSIS — E119 Type 2 diabetes mellitus without complications: Secondary | ICD-10-CM | POA: Diagnosis not present

## 2022-04-06 DIAGNOSIS — Z794 Long term (current) use of insulin: Secondary | ICD-10-CM | POA: Diagnosis not present

## 2022-05-08 DIAGNOSIS — Z794 Long term (current) use of insulin: Secondary | ICD-10-CM | POA: Diagnosis not present

## 2022-05-08 DIAGNOSIS — E119 Type 2 diabetes mellitus without complications: Secondary | ICD-10-CM | POA: Diagnosis not present

## 2022-05-09 DIAGNOSIS — M7061 Trochanteric bursitis, right hip: Secondary | ICD-10-CM | POA: Diagnosis not present

## 2022-06-07 DIAGNOSIS — E119 Type 2 diabetes mellitus without complications: Secondary | ICD-10-CM | POA: Diagnosis not present

## 2022-06-07 DIAGNOSIS — Z794 Long term (current) use of insulin: Secondary | ICD-10-CM | POA: Diagnosis not present

## 2022-06-12 DIAGNOSIS — M47816 Spondylosis without myelopathy or radiculopathy, lumbar region: Secondary | ICD-10-CM | POA: Diagnosis not present

## 2022-06-12 DIAGNOSIS — M533 Sacrococcygeal disorders, not elsewhere classified: Secondary | ICD-10-CM | POA: Diagnosis not present

## 2022-06-12 DIAGNOSIS — G894 Chronic pain syndrome: Secondary | ICD-10-CM | POA: Diagnosis not present

## 2022-06-18 DIAGNOSIS — R2232 Localized swelling, mass and lump, left upper limb: Secondary | ICD-10-CM | POA: Diagnosis not present

## 2022-06-18 DIAGNOSIS — R1310 Dysphagia, unspecified: Secondary | ICD-10-CM | POA: Diagnosis not present

## 2022-06-18 DIAGNOSIS — Z1231 Encounter for screening mammogram for malignant neoplasm of breast: Secondary | ICD-10-CM | POA: Diagnosis not present

## 2022-06-18 DIAGNOSIS — L739 Follicular disorder, unspecified: Secondary | ICD-10-CM | POA: Diagnosis not present

## 2022-06-18 DIAGNOSIS — R002 Palpitations: Secondary | ICD-10-CM | POA: Diagnosis not present

## 2022-06-18 DIAGNOSIS — D509 Iron deficiency anemia, unspecified: Secondary | ICD-10-CM | POA: Diagnosis not present

## 2022-06-26 DIAGNOSIS — N6332 Unspecified lump in axillary tail of the left breast: Secondary | ICD-10-CM | POA: Diagnosis not present

## 2022-06-28 DIAGNOSIS — R2232 Localized swelling, mass and lump, left upper limb: Secondary | ICD-10-CM | POA: Diagnosis not present

## 2022-07-07 DIAGNOSIS — Z794 Long term (current) use of insulin: Secondary | ICD-10-CM | POA: Diagnosis not present

## 2022-07-07 DIAGNOSIS — E119 Type 2 diabetes mellitus without complications: Secondary | ICD-10-CM | POA: Diagnosis not present

## 2022-07-25 DIAGNOSIS — M533 Sacrococcygeal disorders, not elsewhere classified: Secondary | ICD-10-CM | POA: Diagnosis not present

## 2022-08-07 DIAGNOSIS — E119 Type 2 diabetes mellitus without complications: Secondary | ICD-10-CM | POA: Diagnosis not present

## 2022-08-07 DIAGNOSIS — Z794 Long term (current) use of insulin: Secondary | ICD-10-CM | POA: Diagnosis not present

## 2022-09-05 DIAGNOSIS — M25561 Pain in right knee: Secondary | ICD-10-CM | POA: Diagnosis not present

## 2022-09-05 DIAGNOSIS — M25562 Pain in left knee: Secondary | ICD-10-CM | POA: Diagnosis not present

## 2022-09-05 DIAGNOSIS — R262 Difficulty in walking, not elsewhere classified: Secondary | ICD-10-CM | POA: Diagnosis not present

## 2022-09-05 DIAGNOSIS — M170 Bilateral primary osteoarthritis of knee: Secondary | ICD-10-CM | POA: Diagnosis not present

## 2022-09-06 ENCOUNTER — Telehealth: Payer: Self-pay

## 2022-09-06 DIAGNOSIS — E119 Type 2 diabetes mellitus without complications: Secondary | ICD-10-CM | POA: Diagnosis not present

## 2022-09-06 DIAGNOSIS — M5416 Radiculopathy, lumbar region: Secondary | ICD-10-CM | POA: Diagnosis not present

## 2022-09-06 DIAGNOSIS — M4316 Spondylolisthesis, lumbar region: Secondary | ICD-10-CM | POA: Diagnosis not present

## 2022-09-06 DIAGNOSIS — Z794 Long term (current) use of insulin: Secondary | ICD-10-CM | POA: Diagnosis not present

## 2022-09-06 NOTE — Patient Outreach (Signed)
  Care Coordination   09/06/2022 Name: Elaine Blair MRN: [id number] DOB: Nov 02, 1965   Care Coordination Outreach Attempts:  An unsuccessful telephone outreach was attempted today to offer the patient information about available care coordination services as a benefit of their health plan.   Follow Up Plan:  Additional outreach attempts will be made to offer the patient care coordination information and services.   Encounter Outcome:  No Answer   Care Coordination Interventions:  No, not indicated    Rowe Pavy, RN, BSN, [facility] [employer] [phone]

## 2022-09-12 DIAGNOSIS — M47816 Spondylosis without myelopathy or radiculopathy, lumbar region: Secondary | ICD-10-CM | POA: Diagnosis not present

## 2022-09-12 DIAGNOSIS — M170 Bilateral primary osteoarthritis of knee: Secondary | ICD-10-CM | POA: Diagnosis not present

## 2022-09-12 DIAGNOSIS — M7061 Trochanteric bursitis, right hip: Secondary | ICD-10-CM | POA: Diagnosis not present

## 2022-09-12 DIAGNOSIS — Z79899 Other long term (current) drug therapy: Secondary | ICD-10-CM | POA: Diagnosis not present

## 2022-09-12 DIAGNOSIS — G894 Chronic pain syndrome: Secondary | ICD-10-CM | POA: Diagnosis not present

## 2022-09-12 DIAGNOSIS — M25561 Pain in right knee: Secondary | ICD-10-CM | POA: Diagnosis not present

## 2022-09-12 DIAGNOSIS — M533 Sacrococcygeal disorders, not elsewhere classified: Secondary | ICD-10-CM | POA: Diagnosis not present

## 2022-09-12 DIAGNOSIS — R262 Difficulty in walking, not elsewhere classified: Secondary | ICD-10-CM | POA: Diagnosis not present

## 2022-09-12 DIAGNOSIS — M25562 Pain in left knee: Secondary | ICD-10-CM | POA: Diagnosis not present

## 2022-09-13 ENCOUNTER — Telehealth: Payer: Self-pay

## 2022-09-13 NOTE — Patient Outreach (Signed)
  Care Coordination   09/13/2022 Name: Elaine Blair MRN: [id number] DOB: 10-03-1965   Care Coordination Outreach Attempts:  A second unsuccessful outreach was attempted today to offer the patient with information about available care coordination services as a benefit of their health plan.     Follow Up Plan:  Additional outreach attempts will be made to offer the patient care coordination information and services.   Encounter Outcome:  No Answer   Care Coordination Interventions:  No, not indicated    Rowe Pavy, RN, BSN, [facility] [employer] [phone]

## 2022-09-14 ENCOUNTER — Telehealth: Payer: Self-pay

## 2022-09-14 NOTE — Patient Outreach (Signed)
  Care Coordination   09/14/2022 Name: Elaine Blair MRN: [id number] DOB: Dec 18, 1965   Care Coordination Outreach Attempts:  A third unsuccessful outreach was attempted today to offer the patient with information about available care coordination services as a benefit of their health plan.   Follow Up Plan:  No further outreach attempts will be made at this time. We have been unable to contact the patient to offer or enroll patient in care coordination services  Encounter Outcome:  No Answer   Care Coordination Interventions:  No, not indicated    Rowe Pavy, RN, BSN, [facility] Coordinator [phone]

## 2022-09-20 DIAGNOSIS — M5136 Other intervertebral disc degeneration, lumbar region: Secondary | ICD-10-CM | POA: Diagnosis not present

## 2022-09-20 DIAGNOSIS — M4726 Other spondylosis with radiculopathy, lumbar region: Secondary | ICD-10-CM | POA: Diagnosis not present

## 2022-09-25 DIAGNOSIS — M5416 Radiculopathy, lumbar region: Secondary | ICD-10-CM | POA: Diagnosis not present

## 2022-09-25 DIAGNOSIS — M4316 Spondylolisthesis, lumbar region: Secondary | ICD-10-CM | POA: Diagnosis not present

## 2022-09-26 DIAGNOSIS — M1712 Unilateral primary osteoarthritis, left knee: Secondary | ICD-10-CM | POA: Diagnosis not present

## 2022-09-26 DIAGNOSIS — M25561 Pain in right knee: Secondary | ICD-10-CM | POA: Diagnosis not present

## 2022-09-26 DIAGNOSIS — M1711 Unilateral primary osteoarthritis, right knee: Secondary | ICD-10-CM | POA: Diagnosis not present

## 2022-09-26 DIAGNOSIS — M25661 Stiffness of right knee, not elsewhere classified: Secondary | ICD-10-CM | POA: Diagnosis not present

## 2022-09-26 DIAGNOSIS — R262 Difficulty in walking, not elsewhere classified: Secondary | ICD-10-CM | POA: Diagnosis not present

## 2022-09-26 DIAGNOSIS — M25562 Pain in left knee: Secondary | ICD-10-CM | POA: Diagnosis not present

## 2022-09-26 DIAGNOSIS — M25662 Stiffness of left knee, not elsewhere classified: Secondary | ICD-10-CM | POA: Diagnosis not present

## 2022-09-26 DIAGNOSIS — M170 Bilateral primary osteoarthritis of knee: Secondary | ICD-10-CM | POA: Diagnosis not present

## 2022-10-01 DIAGNOSIS — M533 Sacrococcygeal disorders, not elsewhere classified: Secondary | ICD-10-CM | POA: Diagnosis not present

## 2022-10-03 DIAGNOSIS — M25561 Pain in right knee: Secondary | ICD-10-CM | POA: Diagnosis not present

## 2022-10-03 DIAGNOSIS — M25562 Pain in left knee: Secondary | ICD-10-CM | POA: Diagnosis not present

## 2022-10-03 DIAGNOSIS — R262 Difficulty in walking, not elsewhere classified: Secondary | ICD-10-CM | POA: Diagnosis not present

## 2022-10-03 DIAGNOSIS — M170 Bilateral primary osteoarthritis of knee: Secondary | ICD-10-CM | POA: Diagnosis not present

## 2022-10-06 DIAGNOSIS — E119 Type 2 diabetes mellitus without complications: Secondary | ICD-10-CM | POA: Diagnosis not present

## 2022-10-06 DIAGNOSIS — Z794 Long term (current) use of insulin: Secondary | ICD-10-CM | POA: Diagnosis not present

## 2022-10-10 DIAGNOSIS — M170 Bilateral primary osteoarthritis of knee: Secondary | ICD-10-CM | POA: Diagnosis not present

## 2022-10-10 DIAGNOSIS — M25561 Pain in right knee: Secondary | ICD-10-CM | POA: Diagnosis not present

## 2022-10-10 DIAGNOSIS — R262 Difficulty in walking, not elsewhere classified: Secondary | ICD-10-CM | POA: Diagnosis not present

## 2022-10-10 DIAGNOSIS — M25562 Pain in left knee: Secondary | ICD-10-CM | POA: Diagnosis not present

## 2022-11-13 DIAGNOSIS — E119 Type 2 diabetes mellitus without complications: Secondary | ICD-10-CM | POA: Diagnosis not present

## 2022-11-13 DIAGNOSIS — I151 Hypertension secondary to other renal disorders: Secondary | ICD-10-CM | POA: Diagnosis not present

## 2022-11-13 DIAGNOSIS — M1A09X0 Idiopathic chronic gout, multiple sites, without tophus (tophi): Secondary | ICD-10-CM | POA: Diagnosis not present

## 2022-11-13 DIAGNOSIS — Z794 Long term (current) use of insulin: Secondary | ICD-10-CM | POA: Diagnosis not present

## 2022-11-15 DIAGNOSIS — Z794 Long term (current) use of insulin: Secondary | ICD-10-CM | POA: Diagnosis not present

## 2022-11-15 DIAGNOSIS — E119 Type 2 diabetes mellitus without complications: Secondary | ICD-10-CM | POA: Diagnosis not present

## 2022-11-21 DIAGNOSIS — M5416 Radiculopathy, lumbar region: Secondary | ICD-10-CM | POA: Diagnosis not present

## 2022-12-05 DIAGNOSIS — M533 Sacrococcygeal disorders, not elsewhere classified: Secondary | ICD-10-CM | POA: Diagnosis not present

## 2022-12-05 DIAGNOSIS — M47816 Spondylosis without myelopathy or radiculopathy, lumbar region: Secondary | ICD-10-CM | POA: Diagnosis not present

## 2022-12-05 DIAGNOSIS — G894 Chronic pain syndrome: Secondary | ICD-10-CM | POA: Diagnosis not present

## 2022-12-07 DIAGNOSIS — M25561 Pain in right knee: Secondary | ICD-10-CM | POA: Diagnosis not present

## 2022-12-07 DIAGNOSIS — M25562 Pain in left knee: Secondary | ICD-10-CM | POA: Diagnosis not present

## 2022-12-07 DIAGNOSIS — M170 Bilateral primary osteoarthritis of knee: Secondary | ICD-10-CM | POA: Diagnosis not present

## 2022-12-07 DIAGNOSIS — R262 Difficulty in walking, not elsewhere classified: Secondary | ICD-10-CM | POA: Diagnosis not present

## 2022-12-15 DIAGNOSIS — E119 Type 2 diabetes mellitus without complications: Secondary | ICD-10-CM | POA: Diagnosis not present

## 2022-12-15 DIAGNOSIS — Z794 Long term (current) use of insulin: Secondary | ICD-10-CM | POA: Diagnosis not present

## 2023-01-09 DIAGNOSIS — M47816 Spondylosis without myelopathy or radiculopathy, lumbar region: Secondary | ICD-10-CM | POA: Diagnosis not present

## 2023-01-14 DIAGNOSIS — Z794 Long term (current) use of insulin: Secondary | ICD-10-CM | POA: Diagnosis not present

## 2023-01-14 DIAGNOSIS — E119 Type 2 diabetes mellitus without complications: Secondary | ICD-10-CM | POA: Diagnosis not present

## 2023-01-14 DIAGNOSIS — M47816 Spondylosis without myelopathy or radiculopathy, lumbar region: Secondary | ICD-10-CM | POA: Diagnosis not present

## 2023-01-14 LAB — HM HEPATITIS C SCREENING LAB: HM Hepatitis Screen: NEGATIVE

## 2023-01-14 LAB — HIV ANTIBODY (ROUTINE TESTING W REFLEX): HIV 1&2 Ab, 4th Generation: NEGATIVE

## 2023-02-05 DIAGNOSIS — M47816 Spondylosis without myelopathy or radiculopathy, lumbar region: Secondary | ICD-10-CM | POA: Diagnosis not present

## 2023-02-19 DIAGNOSIS — M47816 Spondylosis without myelopathy or radiculopathy, lumbar region: Secondary | ICD-10-CM | POA: Diagnosis not present

## 2023-03-04 DIAGNOSIS — Z794 Long term (current) use of insulin: Secondary | ICD-10-CM | POA: Diagnosis not present

## 2023-03-04 DIAGNOSIS — E119 Type 2 diabetes mellitus without complications: Secondary | ICD-10-CM | POA: Diagnosis not present

## 2023-03-07 DIAGNOSIS — M47816 Spondylosis without myelopathy or radiculopathy, lumbar region: Secondary | ICD-10-CM | POA: Diagnosis not present

## 2023-03-07 DIAGNOSIS — M533 Sacrococcygeal disorders, not elsewhere classified: Secondary | ICD-10-CM | POA: Diagnosis not present

## 2023-03-07 DIAGNOSIS — Z79899 Other long term (current) drug therapy: Secondary | ICD-10-CM | POA: Diagnosis not present

## 2023-03-25 DIAGNOSIS — M47816 Spondylosis without myelopathy or radiculopathy, lumbar region: Secondary | ICD-10-CM | POA: Diagnosis not present

## 2023-03-25 DIAGNOSIS — G894 Chronic pain syndrome: Secondary | ICD-10-CM | POA: Diagnosis not present

## 2023-03-25 DIAGNOSIS — M533 Sacrococcygeal disorders, not elsewhere classified: Secondary | ICD-10-CM | POA: Diagnosis not present

## 2023-04-03 DIAGNOSIS — Z794 Long term (current) use of insulin: Secondary | ICD-10-CM | POA: Diagnosis not present

## 2023-04-03 DIAGNOSIS — E119 Type 2 diabetes mellitus without complications: Secondary | ICD-10-CM | POA: Diagnosis not present

## 2023-04-24 DIAGNOSIS — M170 Bilateral primary osteoarthritis of knee: Secondary | ICD-10-CM | POA: Diagnosis not present

## 2023-05-03 DIAGNOSIS — E119 Type 2 diabetes mellitus without complications: Secondary | ICD-10-CM | POA: Diagnosis not present

## 2023-05-03 DIAGNOSIS — Z794 Long term (current) use of insulin: Secondary | ICD-10-CM | POA: Diagnosis not present

## 2023-05-06 DIAGNOSIS — M75121 Complete rotator cuff tear or rupture of right shoulder, not specified as traumatic: Secondary | ICD-10-CM | POA: Diagnosis not present

## 2023-05-30 DIAGNOSIS — M75121 Complete rotator cuff tear or rupture of right shoulder, not specified as traumatic: Secondary | ICD-10-CM | POA: Diagnosis not present

## 2023-06-03 DIAGNOSIS — Z794 Long term (current) use of insulin: Secondary | ICD-10-CM | POA: Diagnosis not present

## 2023-06-03 DIAGNOSIS — Z23 Encounter for immunization: Secondary | ICD-10-CM | POA: Diagnosis not present

## 2023-06-03 DIAGNOSIS — E119 Type 2 diabetes mellitus without complications: Secondary | ICD-10-CM | POA: Diagnosis not present

## 2023-06-03 DIAGNOSIS — I151 Hypertension secondary to other renal disorders: Secondary | ICD-10-CM | POA: Diagnosis not present

## 2023-06-05 DIAGNOSIS — M75121 Complete rotator cuff tear or rupture of right shoulder, not specified as traumatic: Secondary | ICD-10-CM | POA: Diagnosis not present

## 2023-06-07 DIAGNOSIS — M47816 Spondylosis without myelopathy or radiculopathy, lumbar region: Secondary | ICD-10-CM | POA: Diagnosis not present

## 2023-06-07 DIAGNOSIS — M533 Sacrococcygeal disorders, not elsewhere classified: Secondary | ICD-10-CM | POA: Diagnosis not present

## 2023-06-07 DIAGNOSIS — G894 Chronic pain syndrome: Secondary | ICD-10-CM | POA: Diagnosis not present

## 2023-06-27 DIAGNOSIS — E119 Type 2 diabetes mellitus without complications: Secondary | ICD-10-CM | POA: Diagnosis not present

## 2023-06-27 DIAGNOSIS — Z794 Long term (current) use of insulin: Secondary | ICD-10-CM | POA: Diagnosis not present

## 2023-07-08 DIAGNOSIS — M533 Sacrococcygeal disorders, not elsewhere classified: Secondary | ICD-10-CM | POA: Diagnosis not present

## 2023-07-27 DIAGNOSIS — E119 Type 2 diabetes mellitus without complications: Secondary | ICD-10-CM | POA: Diagnosis not present

## 2023-07-27 DIAGNOSIS — Z794 Long term (current) use of insulin: Secondary | ICD-10-CM | POA: Diagnosis not present

## 2023-08-16 DIAGNOSIS — M1711 Unilateral primary osteoarthritis, right knee: Secondary | ICD-10-CM | POA: Diagnosis not present

## 2023-08-16 DIAGNOSIS — M1712 Unilateral primary osteoarthritis, left knee: Secondary | ICD-10-CM | POA: Diagnosis not present

## 2023-08-26 DIAGNOSIS — E119 Type 2 diabetes mellitus without complications: Secondary | ICD-10-CM | POA: Diagnosis not present

## 2023-08-26 DIAGNOSIS — Z794 Long term (current) use of insulin: Secondary | ICD-10-CM | POA: Diagnosis not present

## 2023-08-28 DIAGNOSIS — G894 Chronic pain syndrome: Secondary | ICD-10-CM | POA: Diagnosis not present

## 2023-08-28 DIAGNOSIS — M170 Bilateral primary osteoarthritis of knee: Secondary | ICD-10-CM | POA: Diagnosis not present

## 2023-08-28 DIAGNOSIS — M47816 Spondylosis without myelopathy or radiculopathy, lumbar region: Secondary | ICD-10-CM | POA: Diagnosis not present

## 2023-08-28 DIAGNOSIS — M533 Sacrococcygeal disorders, not elsewhere classified: Secondary | ICD-10-CM | POA: Diagnosis not present

## 2023-08-28 DIAGNOSIS — Z79899 Other long term (current) drug therapy: Secondary | ICD-10-CM | POA: Diagnosis not present

## 2023-08-30 DIAGNOSIS — M1711 Unilateral primary osteoarthritis, right knee: Secondary | ICD-10-CM | POA: Diagnosis not present

## 2023-09-11 DIAGNOSIS — M1711 Unilateral primary osteoarthritis, right knee: Secondary | ICD-10-CM | POA: Diagnosis not present

## 2023-09-11 DIAGNOSIS — M1712 Unilateral primary osteoarthritis, left knee: Secondary | ICD-10-CM | POA: Diagnosis not present

## 2023-09-11 DIAGNOSIS — M170 Bilateral primary osteoarthritis of knee: Secondary | ICD-10-CM | POA: Diagnosis not present

## 2023-09-20 DIAGNOSIS — G5601 Carpal tunnel syndrome, right upper limb: Secondary | ICD-10-CM | POA: Insufficient documentation

## 2023-09-20 DIAGNOSIS — M5030 Other cervical disc degeneration, unspecified cervical region: Secondary | ICD-10-CM | POA: Diagnosis not present

## 2023-09-20 DIAGNOSIS — Z794 Long term (current) use of insulin: Secondary | ICD-10-CM | POA: Diagnosis not present

## 2023-09-20 DIAGNOSIS — E119 Type 2 diabetes mellitus without complications: Secondary | ICD-10-CM | POA: Diagnosis not present

## 2023-09-20 DIAGNOSIS — M6281 Muscle weakness (generalized): Secondary | ICD-10-CM | POA: Diagnosis not present

## 2023-09-20 DIAGNOSIS — M4722 Other spondylosis with radiculopathy, cervical region: Secondary | ICD-10-CM | POA: Diagnosis not present

## 2023-09-25 DIAGNOSIS — E119 Type 2 diabetes mellitus without complications: Secondary | ICD-10-CM | POA: Diagnosis not present

## 2023-09-25 DIAGNOSIS — Z794 Long term (current) use of insulin: Secondary | ICD-10-CM | POA: Diagnosis not present

## 2023-09-26 DIAGNOSIS — M4316 Spondylolisthesis, lumbar region: Secondary | ICD-10-CM | POA: Diagnosis not present

## 2023-09-26 DIAGNOSIS — M533 Sacrococcygeal disorders, not elsewhere classified: Secondary | ICD-10-CM | POA: Diagnosis not present

## 2023-09-26 DIAGNOSIS — M5416 Radiculopathy, lumbar region: Secondary | ICD-10-CM | POA: Diagnosis not present

## 2023-09-26 DIAGNOSIS — G8929 Other chronic pain: Secondary | ICD-10-CM | POA: Diagnosis not present

## 2023-10-01 DIAGNOSIS — M75121 Complete rotator cuff tear or rupture of right shoulder, not specified as traumatic: Secondary | ICD-10-CM | POA: Diagnosis not present

## 2023-10-01 DIAGNOSIS — M5412 Radiculopathy, cervical region: Secondary | ICD-10-CM | POA: Diagnosis not present

## 2023-10-01 DIAGNOSIS — G5601 Carpal tunnel syndrome, right upper limb: Secondary | ICD-10-CM | POA: Diagnosis not present

## 2023-10-07 DIAGNOSIS — Z79899 Other long term (current) drug therapy: Secondary | ICD-10-CM | POA: Diagnosis not present

## 2023-10-07 DIAGNOSIS — M79609 Pain in unspecified limb: Secondary | ICD-10-CM | POA: Diagnosis not present

## 2023-10-07 DIAGNOSIS — Z01818 Encounter for other preprocedural examination: Secondary | ICD-10-CM | POA: Diagnosis not present

## 2023-10-07 DIAGNOSIS — E559 Vitamin D deficiency, unspecified: Secondary | ICD-10-CM | POA: Diagnosis not present

## 2023-10-07 LAB — LAB REPORT - SCANNED
A1c: 8.1
EGFR: 65
POC INR: 1

## 2023-10-17 DIAGNOSIS — M5412 Radiculopathy, cervical region: Secondary | ICD-10-CM | POA: Diagnosis not present

## 2023-10-21 DIAGNOSIS — M4722 Other spondylosis with radiculopathy, cervical region: Secondary | ICD-10-CM | POA: Diagnosis not present

## 2023-10-21 DIAGNOSIS — M4802 Spinal stenosis, cervical region: Secondary | ICD-10-CM | POA: Diagnosis not present

## 2023-10-25 DIAGNOSIS — E119 Type 2 diabetes mellitus without complications: Secondary | ICD-10-CM | POA: Diagnosis not present

## 2023-10-25 DIAGNOSIS — Z794 Long term (current) use of insulin: Secondary | ICD-10-CM | POA: Diagnosis not present

## 2023-11-24 DIAGNOSIS — E119 Type 2 diabetes mellitus without complications: Secondary | ICD-10-CM | POA: Diagnosis not present

## 2023-11-24 DIAGNOSIS — Z794 Long term (current) use of insulin: Secondary | ICD-10-CM | POA: Diagnosis not present

## 2023-11-27 DIAGNOSIS — M47816 Spondylosis without myelopathy or radiculopathy, lumbar region: Secondary | ICD-10-CM | POA: Diagnosis not present

## 2023-11-27 DIAGNOSIS — G894 Chronic pain syndrome: Secondary | ICD-10-CM | POA: Diagnosis not present

## 2023-11-27 DIAGNOSIS — M533 Sacrococcygeal disorders, not elsewhere classified: Secondary | ICD-10-CM | POA: Diagnosis not present

## 2023-12-24 DIAGNOSIS — Z794 Long term (current) use of insulin: Secondary | ICD-10-CM | POA: Diagnosis not present

## 2023-12-24 DIAGNOSIS — E119 Type 2 diabetes mellitus without complications: Secondary | ICD-10-CM | POA: Diagnosis not present

## 2023-12-31 ENCOUNTER — Other Ambulatory Visit: Payer: Self-pay | Admitting: Physician Assistant

## 2023-12-31 DIAGNOSIS — M5412 Radiculopathy, cervical region: Secondary | ICD-10-CM

## 2023-12-31 DIAGNOSIS — M75121 Complete rotator cuff tear or rupture of right shoulder, not specified as traumatic: Secondary | ICD-10-CM | POA: Diagnosis not present

## 2024-01-09 ENCOUNTER — Encounter (HOSPITAL_BASED_OUTPATIENT_CLINIC_OR_DEPARTMENT_OTHER): Payer: Self-pay

## 2024-01-09 ENCOUNTER — Ambulatory Visit (INDEPENDENT_AMBULATORY_CARE_PROVIDER_SITE_OTHER): Admitting: Student

## 2024-01-09 ENCOUNTER — Encounter (HOSPITAL_BASED_OUTPATIENT_CLINIC_OR_DEPARTMENT_OTHER): Payer: Self-pay | Admitting: Student

## 2024-01-09 VITALS — BP 148/90 | HR 65 | Temp 98.6°F | Resp 16 | Ht 66.14 in | Wt 266.6 lb

## 2024-01-09 DIAGNOSIS — E782 Mixed hyperlipidemia: Secondary | ICD-10-CM

## 2024-01-09 DIAGNOSIS — Z6841 Body Mass Index (BMI) 40.0 and over, adult: Secondary | ICD-10-CM

## 2024-01-09 DIAGNOSIS — M1A09X0 Idiopathic chronic gout, multiple sites, without tophus (tophi): Secondary | ICD-10-CM | POA: Diagnosis not present

## 2024-01-09 DIAGNOSIS — Z7689 Persons encountering health services in other specified circumstances: Secondary | ICD-10-CM | POA: Diagnosis not present

## 2024-01-09 DIAGNOSIS — E119 Type 2 diabetes mellitus without complications: Secondary | ICD-10-CM | POA: Diagnosis not present

## 2024-01-09 DIAGNOSIS — I10 Essential (primary) hypertension: Secondary | ICD-10-CM

## 2024-01-09 DIAGNOSIS — Z1231 Encounter for screening mammogram for malignant neoplasm of breast: Secondary | ICD-10-CM

## 2024-01-09 DIAGNOSIS — N183 Chronic kidney disease, stage 3 unspecified: Secondary | ICD-10-CM

## 2024-01-09 DIAGNOSIS — Z794 Long term (current) use of insulin: Secondary | ICD-10-CM

## 2024-01-09 MED ORDER — TIRZEPATIDE 2.5 MG/0.5ML ~~LOC~~ SOAJ: 2.5000 mg | SUBCUTANEOUS | 0 refills | Status: DC

## 2024-01-09 NOTE — Patient Instructions (Signed)
 It was nice to see you today!  If you have any problems before your next visit feel free to message me via MyChart (minor issues or questions) or call the office, otherwise you may reach out to schedule an office visit.  Thank you! Pau Banh, PA-C

## 2024-01-09 NOTE — Progress Notes (Signed)
 New Patient Office Visit  Subjective    Patient ID: Elaine Blair, female    DOB: [birth year]  Age: 58 y.o. MRN: [id number]  CC:  Chief Complaint  Patient presents with   Establish Care    Here to establish care.   Diabetes    Wanted to see if we could switch pt's diabetic meds. Was going to [facility], at [employer].    Discussed the use of AI scribe software for clinical note transcription with the patient, who gave verbal consent to proceed.  History of Present Illness   Elaine Blair is a 58 year old female with type 2 diabetes who presents to establish care and discuss diabetes management.  She has been previously managed by family medicine and was seen by an endocrinologist in [place] after her sepsis episode, but was not followed long-term by endocrinology. She has a history of sepsis in 2016-2017, which affected her kidneys, but her kidney function returned to normal. She has not had an eye exam this year but had one last year. She has not had a foot exam recently. She is currently on Ozempic 2 mg, which sometimes causes diarrhea, and she uses Imodium to manage this. She is also on a sliding scale insulin regimen with Lantus at night, adjusting the dose based on her glucose readings. She uses a [employer] 3 continuous glucose monitor but has experienced issues with the sensors. She is also taking vitamin C, which may affect her glucose monitor readings.  She has a history of high cholesterol and was previously on medication, which she stopped due to elevated blood sugar levels. She is currently focusing on a healthy diet, primarily consisting of vegetables, salmon, and seafood, and has lost weight recently. She aims to lose 4-5 more pounds and hopes to eventually come off her medications.  She reports shoulder pain due to a pinched nerve and arthritis, which has been ongoing since December. She experienced a recurrence of severe pain two and a half weeks ago. She has had an  injection in her shoulder, which did not alleviate the pain, and she is scheduled for imaging and an injection in her neck. She is taking Tylenol  for pain but has not tried Voltaren gel.  She is on allopurinol for gout and reports that her condition is well-managed. She has not had a recent uric acid level check.  Her father had tuberculosis and passed away when she was a year [age] a [age] old. She and her siblings were tested for tuberculosis for years but never tested positive. She does not smoke or vape and is due for a mammogram.      Screenings:  Colon Cancer: up to date- last one came back well. Lung Cancer: No smoking or vaping Breast Cancer: Due.  Diabetes: indicated  Ophthalmology: indicated Foot: indicated HLD: indicated   Outpatient Encounter Medications as of 01/09/2024  Medication Sig   ACCU-CHEK GUIDE TEST test strip 1 each by Other route 5 (five) times daily.   albuterol (VENTOLIN HFA) 108 (90 Base) MCG/ACT inhaler Inhale 2 puffs into the lungs as needed.   allopurinol (ZYLOPRIM) 100 MG tablet Take 100 mg by mouth daily.   ascorbic acid (VITAMIN C) 500 MG tablet Take 500 mg by mouth daily.   BELBUCA  750 MCG FILM Take 750 mcg by mouth as needed. Under tongue every 12 hours   Cholecalciferol 10 MCG (400 UNIT) CAPS Take 5,000 Units by mouth daily.   Continuous Glucose Sensor (FREESTYLE [place]  3 SENSOR) MISC Inject 1 application  into the skin as directed.   glucose blood (ACCU-CHEK GUIDE TEST) test strip 1 each by Other route as needed.   HUMALOG KWIKPEN 100 UNIT/ML KwikPen Inject 15 Units into the skin as needed. 15-25 UNITS AS NEEDED, SLIDING SCALE   hydrALAZINE (APRESOLINE) 100 MG tablet Take 100 mg by mouth 3 (three) times daily.   hydrochlorothiazide (HYDRODIURIL) 50 MG tablet Take 50 mg by mouth daily.   MAGNESIUM PO Take 500 mg by mouth daily.   Multiple Vitamin (MULTI-VITAMIN) tablet Take 1 tablet by mouth daily.   omeprazole (PRILOSEC) 20 MG capsule Take 20 mg by mouth  daily.   tirzepatide  (MOUNJARO ) 2.5 MG/0.5ML Pen Inject 2.5 mg into the skin once a week.   triamcinolone  cream (KENALOG ) 0.1 % Apply 1 Application topically as needed. to affected area(s)   triamcinolone  ointment (KENALOG ) 0.1 % Apply 1 Application topically as needed.   [DISCONTINUED] insulin aspart (NOVOLOG) 100 UNIT/ML injection Inject 5-6 Units into the skin 3 (three) times daily before meals. Per sliding scale   [DISCONTINUED] insulin detemir (LEVEMIR) 100 UNIT/ML injection Inject 18 Units into the skin at bedtime.   LANTUS SOLOSTAR 100 UNIT/ML Solostar Pen Inject 25 Units into the skin as needed. SLIDING SCALE   losartan-hydrochlorothiazide (HYZAAR) 100-25 MG tablet Take 1 tablet by mouth daily.   mupirocin ointment (BACTROBAN) 2 % Apply 1 Application topically as needed.   [DISCONTINUED] cephALEXin  (KEFLEX ) 500 MG capsule Take 1 capsule (500 mg total) by mouth 4 (four) times daily.   [DISCONTINUED] OZEMPIC, 2 MG/DOSE, 8 MG/3ML SOPN Inject 2 mg into the skin once a week.   No facility-administered encounter medications on file as of 01/09/2024.    Past Medical History:  Diagnosis Date   Diabetes mellitus without complication (HCC)    Gout    History of abdominal abscess 11/16/2016   Hypertension    Osteoarthritis     Past Surgical History:  Procedure Laterality Date   CESAREAN SECTION     x2   SHOULDER SURGERY     shoulder repair    Family History  Problem Relation Age of Onset   Hypertension Mother    Diabetes Mother    Tuberculosis Father     Social History   Socioeconomic History   Marital status: Single    Spouse name: Not on file   Number of children: 2   Years of education: Not on file   Highest education level: Not on file  Occupational History   Not on file  Tobacco Use   Smoking status: Never    Passive exposure: Never   Smokeless tobacco: Never  Vaping Use   Vaping status: Never Used  Substance and Sexual Activity   Alcohol use: No   Drug use:  No   Sexual activity: Not on file  Other Topics Concern   Not on file  Social History Narrative   2 daughters    Social Drivers of Health   Financial Resource Strain: Not on file  Food Insecurity: No Food Insecurity (01/09/2024)   Hunger Vital Sign    Worried About Running Out of Food in the Last Year: Never true    Ran Out of Food in the Last Year: Never true  Transportation Needs: No Transportation Needs (01/09/2024)   PRAPARE - Administrator, Civil Service (Medical): No    Lack of Transportation (Non-Medical): No  Physical Activity: Not on file  Stress: Not on file  Social  Connections: Not on file  Intimate Partner Violence: Not At Risk (01/09/2024)   Humiliation, Afraid, Rape, and Kick questionnaire    Fear of Current or Ex-Partner: No    Emotionally Abused: No    Physically Abused: No    Sexually Abused: No    ROS  Per HPI      Objective    BP (!) 167/85   Pulse 65   Temp 98.6 F (37 C) (Oral)   Resp 16   Ht 5' 6.14 (1.68 m)   Wt 266 lb 9.6 oz (120.9 kg)   SpO2 95%   BMI 42.85 kg/m   Physical Exam Constitutional:      General: She is not in acute distress.    Appearance: Normal appearance. She is not ill-appearing.  HENT:     Head: Normocephalic and atraumatic.     Right Ear: External ear normal.     Left Ear: External ear normal.     Nose: Nose normal.     Mouth/Throat:     Mouth: Mucous membranes are moist.     Pharynx: Oropharynx is clear.  Eyes:     General: No scleral icterus.    Extraocular Movements: Extraocular movements intact.     Conjunctiva/sclera: Conjunctivae normal.     Pupils: Pupils are equal, round, and reactive to light.  Neck:     Vascular: No carotid bruit.  Cardiovascular:     Rate and Rhythm: Normal rate and regular rhythm.     Pulses: Normal pulses.     Heart sounds: Normal heart sounds. No murmur heard.    No friction rub.  Pulmonary:     Effort: Pulmonary effort is normal. No respiratory distress.      Breath sounds: Normal breath sounds. No wheezing, rhonchi or rales.  Musculoskeletal:        General: Normal range of motion.     Cervical back: Neck supple.     Right lower leg: No edema.     Left lower leg: No edema.  Skin:    General: Skin is warm and dry.     Coloration: Skin is not jaundiced or pale.  Neurological:     General: No focal deficit present.     Mental Status: She is alert.     Deep Tendon Reflexes: Reflexes normal.  Psychiatric:        Mood and Affect: Mood normal.        Behavior: Behavior normal.         Assessment & Plan:   Assessment and Plan    Type 2 diabetes mellitus Chronic, with side effects of medication.  Type 2 diabetes mellitus with gastrointestinal side effects from Ozempic, including diarrhea. Considering switching to Mounjaro  due to stomach irritation. Current management includes insulin therapy with Lantus and sliding scale insulin. Recent weight loss with dietary changes focused on vegetables and seafood. Continuous glucose monitoring with Freestyle [employer] 3, though issues with sensor accuracy possibly due to vitamin C intake. Discussed potential for sugar spikes during Mounjaro  titration and need for possible insulin adjustment. - Order labs to update A1c, check for proteinuria, and assess kidney and liver function - Discontinue vitamin C to improve Freestyle Libre 3 accuracy - Initiate Mounjaro  at 2.5 mg with titration every 4 weeks - Schedule follow-up every 4 weeks during Mounjaro  titration - Continue Lantus and sliding scale insulin as needed - Monitor blood glucose levels closely during Mounjaro  titration - Obtain records of recent eye exam for diabetes management  Chronic right shoulder pain with probable osteoarthritis and nerve impingement Chronic not at goal.  Chronic right shoulder pain with probable osteoarthritis and nerve impingement. Recent exacerbation of pain with inability to lift the arm. Scheduled for imaging and injection at  [facility] on [address]. Pain management with Tylenol , but considering Voltaren gel for localized pain relief. - Proceed with scheduled imaging and injection at [facility] - Recommend Voltaren gel for topical pain relief - Continue Tylenol  as needed for pain management  Gout Chronic, stable.  Gout managed with allopurinol. No recent uric acid level check. - Order uric acid level to ensure therapeutic range - Continue allopurinol at current dose.  Hyperlipidemia Hyperlipidemia previously managed with medication, but currently focusing on dietary changes. Recent weight loss with a diet rich in vegetables and seafood. - Continue lifestyle management.  Hypertension Chronic, stable though increase in clinic today likely due to pain.  Hypertension well-controlled with home blood pressure readings averaging 120s/80s. - Continue current regimen.  Obesity Patient is currently lost around 15 pounds.  She is following a vegetarian diet.  This should help with weight management. - Continue lifestyle modification.  Chronic kidney disease Reassess kidney function today including urine microalbumin.   General Health Maintenance Up to date on colonoscopy. Due for a mammogram. Plans to schedule an eye exam for diabetes management. - Order mammogram - Schedule eye exam for diabetes management      Return in about 4 weeks (around 02/06/2024) for DM.   Cola Highfill T Jeanet Lupe, PA-C

## 2024-01-10 NOTE — Discharge Instructions (Signed)

## 2024-01-13 ENCOUNTER — Inpatient Hospital Stay
Admission: RE | Admit: 2024-01-13 | Discharge: 2024-01-13 | Disposition: A | Discharge status: Home / Self Care | Source: Ambulatory Visit | Attending: Physician Assistant | Admitting: Physician Assistant

## 2024-01-13 DIAGNOSIS — M5412 Radiculopathy, cervical region: Secondary | ICD-10-CM

## 2024-01-13 DIAGNOSIS — M4722 Other spondylosis with radiculopathy, cervical region: Secondary | ICD-10-CM | POA: Diagnosis not present

## 2024-01-13 MED ORDER — TRIAMCINOLONE ACETONIDE 40 MG/ML IJ SUSP (RADIOLOGY)
60.0000 mg | Freq: Once | INTRAMUSCULAR | Status: AC
  Administered 2024-01-13: 60 mg via EPIDURAL

## 2024-01-13 MED ORDER — IOPAMIDOL (ISOVUE-M 300) INJECTION 61%
1.0000 mL | Freq: Once | INTRAMUSCULAR | Status: AC | PRN
Start: 2024-01-13 — End: 2024-01-13
  Administered 2024-01-13: 1 mL via EPIDURAL

## 2024-01-14 ENCOUNTER — Telehealth (HOSPITAL_BASED_OUTPATIENT_CLINIC_OR_DEPARTMENT_OTHER): Payer: Self-pay

## 2024-01-14 NOTE — Telephone Encounter (Signed)
 We received surgical clearance from ortho & sports. Lang said to please change lab appt to an OV. LMTCB.

## 2024-01-15 ENCOUNTER — Other Ambulatory Visit (HOSPITAL_BASED_OUTPATIENT_CLINIC_OR_DEPARTMENT_OTHER)

## 2024-01-17 ENCOUNTER — Ambulatory Visit (HOSPITAL_BASED_OUTPATIENT_CLINIC_OR_DEPARTMENT_OTHER): Admitting: Student

## 2024-01-17 DIAGNOSIS — M1712 Unilateral primary osteoarthritis, left knee: Secondary | ICD-10-CM | POA: Diagnosis not present

## 2024-01-17 NOTE — Progress Notes (Deleted)
 Preop Clearance  MICA Zane) score: 0.6 PERCENT RISK - If >1% or risk modifier present then do DASI frailty score  -Risk modifiers include valvular disease(severe), severe pulm HTN, elevated risk CHD, PCI with stent, CABG, recent stroke, pacemaker, frailty.  DASI score: *** -If the DASI is abnormal (less than 35 or less than 4 METS) then consider delaying surgery, alternative treatment, or obtaining BNP/troponin  Cardiac OSA: *** -if so, consider upright sleeping, opioid avoidance, inpatient monitoring rather than a sleep study  A-fib: Controlled/uncontrolled?  Heart failure: Controlled/uncontrolled? (*** appears compensated with no current signs of hypervolemia)  Hypertension: Controlled/uncontrolled? BP: *** If consistently >180/110 consider optimization before proceeding with surgery.  Troponin?: *** -helpful in evaluating for noncardiac elevations after surgery such as sepsis  BNP?: *** -Helpful in determining HF risk before stress testing in borderline patients  Cardiac meds: *** -ACE/ARBs - For HFrEF patients, continue (2A); for well-controlled BP and high-risk surgery, consider holding (2B); case-by-case decision-making is key. Beta-blockers should not be started the day before surgery due to the risk of intraoperative hypotension. They should be continued perioperatively if patients are on them chronically, however.  -ACE/ARB: *** Possible side effect: These can cause perioperative hypotension.   Preop recommendation: Is reasonable to hold this on the morning of surgery if being treated for hypertension Discrepancy: Is reasonable to continue as indicated for heart failure as above. -Beta-blocker: *** Possible side effect: This can cause perioperative hypotension. Preop recommendation: Continued throughout perioperative period. Discrepancy do not start a new beta-blocker the day before surgery.  Endocrine Diabetes: Controlled/uncontrolled? Diabetes meds: -SGLT2:  *** Possible side effect: Euglycemic DKA. Preop recommendation: Hold 3 days prior to operation. Discrepancy: Please be careful with postoperative reintroduction, as the patient will need to be tolerating p.o. -Loops: *** For fragile CHF patients, continue loop diuretics up until the morning of surgery. Hold SGLT2 inhibitors but have a PRN plan for diuresis if volume overload is a concern - GLP-1: *** Possible side effect: Aspiration Pre-Op recommendation: Consider omission of dose the week before surgery. Discrepancy: This is a dynamic topic and more data as needed to assess for optimal timing of omission.  Hepatic Cirrhosis: Controlled/uncontrolled?  Pulmonary Asthma: Controlled/uncontrolled? COPD: Controlled/uncontrolled?  ____________________________________________________________  Frailty reflects physiologic reserve, guiding prehab, post-op expectations, and the decision between invasive vs. less invasive surgery. It is different than disability or comorbidity, although patients may have both.Who will be there to assist at home? What resources can be mobilized to ease the transition postoperatively?  NPO is unlikely to be necessary at midnight before the operation (unless institutionally required)- NPO is necessary with clear liquids 2 hours prior to surgery.  Frailty reflects physiologic reserve, guiding prehab, post-op expectations, and the decision between invasive vs. less invasive surgery. It is different than disability or comorbidity, although patients may have both.   testing coags before surgery (in the absence of a specific indication) does not improve outcomes Floy 2014, Marland 2008)

## 2024-01-20 ENCOUNTER — Ambulatory Visit (HOSPITAL_BASED_OUTPATIENT_CLINIC_OR_DEPARTMENT_OTHER): Admitting: Student

## 2024-01-20 ENCOUNTER — Ambulatory Visit (INDEPENDENT_AMBULATORY_CARE_PROVIDER_SITE_OTHER): Admitting: Student

## 2024-01-20 ENCOUNTER — Ambulatory Visit: Payer: Self-pay

## 2024-01-20 ENCOUNTER — Encounter (HOSPITAL_BASED_OUTPATIENT_CLINIC_OR_DEPARTMENT_OTHER): Payer: Self-pay

## 2024-01-20 VITALS — BP 117/81 | HR 87 | Temp 98.6°F | Resp 16 | Ht 66.0 in | Wt 263.1 lb

## 2024-01-20 DIAGNOSIS — I10 Essential (primary) hypertension: Secondary | ICD-10-CM | POA: Diagnosis not present

## 2024-01-20 DIAGNOSIS — J019 Acute sinusitis, unspecified: Secondary | ICD-10-CM

## 2024-01-20 DIAGNOSIS — Z01818 Encounter for other preprocedural examination: Secondary | ICD-10-CM | POA: Diagnosis not present

## 2024-01-20 DIAGNOSIS — E782 Mixed hyperlipidemia: Secondary | ICD-10-CM | POA: Diagnosis not present

## 2024-01-20 DIAGNOSIS — Z794 Long term (current) use of insulin: Secondary | ICD-10-CM | POA: Diagnosis not present

## 2024-01-20 DIAGNOSIS — E119 Type 2 diabetes mellitus without complications: Secondary | ICD-10-CM | POA: Diagnosis not present

## 2024-01-20 DIAGNOSIS — M1A09X0 Idiopathic chronic gout, multiple sites, without tophus (tophi): Secondary | ICD-10-CM

## 2024-01-20 LAB — POCT GLUCOSE (DEVICE FOR HOME USE): POC Glucose: 144 mg/dL — AB (ref 70–99)

## 2024-01-20 MED ORDER — DEXCOM G7 RECEIVER DEVI: 0 refills | Status: DC

## 2024-01-20 MED ORDER — DEXCOM G7 SENSOR MISC
6 refills | Status: DC
Start: 2024-01-20 — End: 2024-01-23

## 2024-01-20 NOTE — Addendum Note (Signed)
 Addended by: MIRIAM JOSEPH on: 01/20/2024 01:52 PM   Modules accepted: Orders

## 2024-01-20 NOTE — Progress Notes (Unsigned)
 Preop Clearance  MICA Zane) score: 0.4% - If >1% or risk modifier present then do DASI frailty score  -Risk modifiers include valvular disease(severe), severe pulm HTN, elevated risk CHD, PCI with stent, CABG, recent stroke, pacemaker, frailty.  DASI score: *** -If the DASI is abnormal (less than 35 or less than 4 METS) then consider delaying surgery, alternative treatment, or obtaining BNP/troponin  Cardiac OSA: no -if so, consider upright sleeping, opioid avoidance, inpatient monitoring rather than a sleep study  A-fib: Controlled/uncontrolled? N/A  Heart failure: Controlled/uncontrolled? N/A  Hypertension: Controlled/uncontrolled? Controlled BP: 117/81 If consistently >180/110 consider optimization before proceeding with surgery.  Troponin?: N/A  BNP?: N/A  Cardiac meds: N/A -ACE/ARBs - For HFrEF patients, continue (2A); for well-controlled BP and high-risk surgery, consider holding (2B); case-by-case decision-making is key. Beta-blockers should not be started the day before surgery due to the risk of intraoperative hypotension. They should be continued perioperatively if patients are on them chronically, however.  -ACE/ARB: N/A Possible side effect: These can cause perioperative hypotension.   Preop recommendation: Is reasonable to hold this on the morning of surgery if being treated for hypertension Discrepancy: Is reasonable to continue as indicated for heart failure as above. -Beta-blocker: N/A Possible side effect: This can cause perioperative hypotension. Preop recommendation: Continued throughout perioperative period. Discrepancy do not start a new beta-blocker the day before surgery.  Endocrine Diabetes: Controlled/uncontrolled? Getting   Diabetes meds: -SGLT2: N/A Possible side effect: Euglycemic DKA. Preop recommendation: Hold 3 days prior to operation. Discrepancy: Please be careful with postoperative reintroduction, as the patient will need to be  tolerating p.o. -Loops: N/A For fragile CHF patients, continue loop diuretics up until the morning of surgery. Hold SGLT2 inhibitors but have a PRN plan for diuresis if volume overload is a concern - GLP-1: YES- Mounjaro  2.5  Possible side effect: Aspiration Pre-Op recommendation: Consider omission of dose the week before surgery. Discrepancy: This is a dynamic topic and more data as needed to assess for optimal timing of omission.  Hepatic Cirrhosis: Controlled/uncontrolled? N/A  Pulmonary Asthma: Controlled/uncontrolled? N/A (only uses albuterol during sickness) COPD: Controlled/uncontrolled? N/A  ____________________________________________________________  Frailty reflects physiologic reserve, guiding prehab, post-op expectations, and the decision between invasive vs. less invasive surgery. It is different than disability or comorbidity, although patients may have both.Who will be there to assist at home? What resources can be mobilized to ease the transition postoperatively?  NPO is unlikely to be necessary at midnight before the operation (unless institutionally required)- NPO is necessary with clear liquids 2 hours prior to surgery.  Frailty reflects physiologic reserve, guiding prehab, post-op expectations, and the decision between invasive vs. less invasive surgery. It is different than disability or comorbidity, although patients may have both.   testing coags before surgery (in the absence of a specific indication) does not improve outcomes Floy 2014, Marland 2008)

## 2024-01-20 NOTE — Patient Instructions (Signed)
 It was nice to see you today!  As we discussed in clinic:  [email or web address]  If you have any problems before your next visit feel free to message me via MyChart (minor issues or questions) or call the office, otherwise you may reach out to schedule an office visit.  Thank you! Vernel Donlan, PA-C

## 2024-01-20 NOTE — Progress Notes (Unsigned)
 Established Patient Office Visit  Subjective   Patient ID: Elaine Blair, female    DOB: 03-23-1966  Age: 58 y.o. MRN: [id number]  Chief Complaint  Patient presents with   Joint pain    Right hip pain.    Surgical clerance    Having right shoulder surgery. Has had some kenalog  shot recently.    Medical Management of Chronic Issues    Would like to switch from freestyle libre 3 plus to dexcom. It keeps falling out. Reader stopped working and it did not work out for her.      HPI  Discussed the use of AI scribe software for clinical note transcription with the patient, who gave verbal consent to proceed.  History of Present Illness   Elaine Blair is a 58 year old female with diabetes who presents for surgical clearance and management of blood sugar levels.  She experiences pain in her right sacroiliac joint, described as 'popping' and causing significant discomfort, which has previously impaired her ability to walk. She attributes the onset of this pain to nerve inflammation injections, having received two injections, with the most recent one earlier this week.  She is concerned about her blood sugar levels, particularly in relation to her upcoming surgery. Her A1c was one point over the acceptable level for surgery three months ago, which she attributes to steroid injections. She recently switched from Ozempic to Mounjaro  for diabetes management and has noticed fluctuations in her blood sugar levels. She is currently on a starting dose of Mounjaro  and is monitoring her blood sugar regularly. She uses sliding scale insulin and has adjusted her Lantus dosage as needed.  She has a history of thyroid  issues, which were previously overactive and resolved spontaneously. She reports feeling her body is 'wide open' and experiencing fatigue, and is concerned her thyroid  may be 'triggered' again. She is on a diet to manage her weight and salt intake, which she believes may be affecting  her thyroid  function.  She has a history of sepsis, which she believes may have affected her thyroid  function in the past. No history of liver issues, asthma, COPD, sleep apnea, AFib, or heart failure. She is currently taking B12 shots for fatigue, which she attributes to low B12 levels in the past.      Patient Active Problem List   Diagnosis Date Noted   Carpal tunnel syndrome, right 09/20/2023   IDA (iron deficiency anemia) 03/20/2022   Hypertension secondary to other renal disorders 11/24/2018   Acquired absence of cervix 08/07/2018   Stage 3 chronic kidney disease (HCC) 04/11/2018   Mixed hyperlipidemia 02/12/2017   Chronic gout of multiple sites 02/01/2017   Gastroesophageal reflux disease without esophagitis 02/01/2017   Spondylosis of lumbar spine 02/01/2017   Primary osteoarthritis involving multiple joints 02/01/2017   Diabetes mellitus type 2, insulin dependent (HCC) 11/16/2016   HTN (hypertension) 11/16/2016   Past Medical History:  Diagnosis Date   Diabetes mellitus without complication (HCC)    Gout    History of abdominal abscess 11/16/2016   Hypertension    Osteoarthritis    Past Surgical History:  Procedure Laterality Date   CESAREAN SECTION     x2   SHOULDER SURGERY     shoulder repair   Social History   Tobacco Use   Smoking status: Never    Passive exposure: Never   Smokeless tobacco: Never  Vaping Use   Vaping status: Never Used  Substance Use Topics   Alcohol use:  No   Drug use: No   Allergies  Allergen Reactions   Latex Hives, Swelling and Rash    Rash       ROS Per HPI.    Objective:     BP 117/81   Pulse 87   Temp 98.6 F (37 C) (Oral)   Resp 16   Ht 5' 6 (1.676 m)   Wt 263 lb 1.6 oz (119.3 kg)   SpO2 96%   BMI 42.47 kg/m  BP Readings from Last 3 Encounters:  01/20/24 117/81  01/13/24 (!) 189/113  01/09/24 (!) 148/90   Wt Readings from Last 3 Encounters:  01/20/24 263 lb 1.6 oz (119.3 kg)  01/09/24 266 lb 9.6 oz  (120.9 kg)      Physical Exam Constitutional:      General: She is not in acute distress.    Appearance: Normal appearance. She is not ill-appearing.  HENT:     Head: Normocephalic and atraumatic.     Nose: Nose normal.  Eyes:     General: No scleral icterus.    Conjunctiva/sclera: Conjunctivae normal.  Cardiovascular:     Rate and Rhythm: Normal rate and regular rhythm.     Heart sounds: Normal heart sounds. No murmur heard.    No friction rub.  Pulmonary:     Effort: Pulmonary effort is normal. No respiratory distress.     Breath sounds: Normal breath sounds. No wheezing, rhonchi or rales.  Musculoskeletal:        General: Normal range of motion.  Skin:    General: Skin is warm and dry.     Coloration: Skin is not jaundiced or pale.  Neurological:     General: No focal deficit present.     Mental Status: She is alert.  Psychiatric:        Mood and Affect: Mood normal.        Behavior: Behavior normal.      Results for orders placed or performed in visit on 01/20/24  TSH  Result Value Ref Range   TSH 0.647 0.450 - 4.500 uIU/mL  Microalbumin / creatinine urine ratio  Result Value Ref Range   Creatinine, Urine 301.3 Not Estab. mg/dL   Microalbumin, Urine 64.5 Not Estab. ug/mL   Microalb/Creat Ratio 12 0 - 29 mg/g creat  Lipid panel  Result Value Ref Range   Cholesterol, Total 211 (H) 100 - 199 mg/dL   Triglycerides 768 (H) 0 - 149 mg/dL   HDL 45 >60 mg/dL   VLDL Cholesterol Cal 41 (H) 5 - 40 mg/dL   LDL Chol Calc (NIH) 874 (H) 0 - 99 mg/dL   Chol/HDL Ratio 4.7 (H) 0.0 - 4.4 ratio  Hemoglobin A1c  Result Value Ref Range   Hgb A1c MFr Bld 9.3 (H) 4.8 - 5.6 %   Est. average glucose Bld gHb Est-mCnc 220 mg/dL  Comprehensive metabolic panel with GFR  Result Value Ref Range   Glucose 84 70 - 99 mg/dL   BUN 39 (H) 8 - 27 mg/dL   Creatinine, Ser 8.70 (H) 0.57 - 1.00 mg/dL   eGFR 48 (L) >40 fO/fpw/8.26   BUN/Creatinine Ratio 30 (H) 12 - 28   Sodium 138 134 - 144  mmol/L   Potassium 4.5 3.5 - 5.2 mmol/L   Chloride 100 96 - 106 mmol/L   CO2 23 20 - 29 mmol/L   Calcium  9.6 8.7 - 10.3 mg/dL   Total Protein 7.2 6.0 - 8.5 g/dL   Albumin 4.1  3.8 - 4.9 g/dL   Globulin, Total 3.1 1.5 - 4.5 g/dL   Bilirubin Total 0.4 0.0 - 1.2 mg/dL   Alkaline Phosphatase 79 44 - 121 IU/L   AST 17 0 - 40 IU/L   ALT 28 0 - 32 IU/L  CBC with Differential/Platelet  Result Value Ref Range   WBC 12.4 (H) 3.4 - 10.8 x10E3/uL   RBC 4.76 3.77 - 5.28 x10E6/uL   Hemoglobin 12.4 11.1 - 15.9 g/dL   Hematocrit 59.9 65.9 - 46.6 %   MCV 84 79 - 97 fL   MCH 26.1 (L) 26.6 - 33.0 pg   MCHC 31.0 (L) 31.5 - 35.7 g/dL   RDW 85.9 88.2 - 84.5 %   Platelets 438 150 - 450 x10E3/uL   Neutrophils 79 Not Estab. %   Lymphs 16 Not Estab. %   Monocytes 5 Not Estab. %   Eos 0 Not Estab. %   Basos 0 Not Estab. %   Neutrophils Absolute 9.7 (H) 1.4 - 7.0 x10E3/uL   Lymphocytes Absolute 2.0 0.7 - 3.1 x10E3/uL   Monocytes Absolute 0.6 0.1 - 0.9 x10E3/uL   EOS (ABSOLUTE) 0.0 0.0 - 0.4 x10E3/uL   Basophils Absolute 0.0 0.0 - 0.2 x10E3/uL   Immature Granulocytes 0 Not Estab. %   Immature Grans (Abs) 0.0 0.0 - 0.1 x10E3/uL  Uric acid  Result Value Ref Range   Uric Acid 7.8 (H) 3.0 - 7.2 mg/dL  POCT Glucose (Device for Home Use)  Result Value Ref Range   Glucose Fasting, POC (A)    POC Glucose 144 (A) 70 - 99 mg/dl    Last CBC Lab Results  Component Value Date   WBC 12.4 (H) 01/20/2024   HGB 12.4 01/20/2024   HCT 40.0 01/20/2024   MCV 84 01/20/2024   MCH 26.1 (L) 01/20/2024   RDW 14.0 01/20/2024   PLT 438 01/20/2024   Last metabolic panel Lab Results  Component Value Date   GLUCOSE 84 01/20/2024   NA 138 01/20/2024   K 4.5 01/20/2024   CL 100 01/20/2024   CO2 23 01/20/2024   BUN 39 (H) 01/20/2024   CREATININE 1.29 (H) 01/20/2024   EGFR 48 (L) 01/20/2024   CALCIUM  9.6 01/20/2024   PROT 7.2 01/20/2024   ALBUMIN 4.1 01/20/2024   LABGLOB 3.1 01/20/2024   BILITOT 0.4 01/20/2024    ALKPHOS 79 01/20/2024   AST 17 01/20/2024   ALT 28 01/20/2024   ANIONGAP 11 06/29/2018   Last lipids Lab Results  Component Value Date   CHOL 211 (H) 01/20/2024   HDL 45 01/20/2024   LDLCALC 125 (H) 01/20/2024   TRIG 231 (H) 01/20/2024   CHOLHDL 4.7 (H) 01/20/2024   Last hemoglobin A1c Lab Results  Component Value Date   HGBA1C 9.3 (H) 01/20/2024      The 10-year ASCVD risk score (Arnett DK, et al., 2019) is: 14.2%    Assessment & Plan:   Assessment and Plan    Preoperative Evaluation for Surgical Clearance Surgical clearance is needed for an upcoming procedure at [facility]. The main concern is blood sugar control due to recent changes in diabetes medication and steroid injections, which may affect A1c levels. Currently on Mounjaro  at a starting dose, blood sugar levels may not be adequately controlled for surgical clearance. - Communicate with the surgical team at [facility] regarding current A1c and blood sugar control. - Order blood work including A1c to assess current levels. - Discuss with the surgical team  whether to proceed with surgery based on current A1c levels.  UPDATE: Surgical Clearance denied due to A1C  Type 2 Diabetes Mellitus Chronic, not at goal. Type 2 Diabetes Mellitus with recent transition from Ozempic to Mounjaro . Blood sugar levels have been elevated, likely due to recent steroid injections and the change in diabetes medication. Mounjaro  is at a starting dose, with plans to titrate up to a maintenance dose over the next few months. Concern exists about daily blood sugar control and its impact on surgical healing post-procedure. - Continue Mounjaro  at the current dose and plan to titrate up every four weeks. - Monitor blood sugar levels regularly. - Order random glucose test today. - Educate on managing hypoglycemia with 15 grams of carbohydrates every 15 minutes if needed. - Plan to increase Mounjaro  dose at the next visit on  February 06, 2024.  UPDATE: A1C at 9.3.  Right Sacroiliac Joint Pain Right sacroiliac joint pain exacerbated by recent injections, significantly affecting mobility. Previous injections have provided relief, but there is concern about the accuracy of injection placement if done outside a specialized setting. - Pt is planning to discuss orthopedics for potential injection under fluoroscopy for precise placement.  UPDATE: Went to ortho the morning of 01/23/24  Hypertension Chronic, stable. Hypertension is well-controlled with current medication regimen. Blood pressure today is 117/81 mmHg. - Continue current regimen  Hx of prior thyroid  issues Previously triggered by sepsis. Reports feeling that her thyroid  may be acting up again, possibly due to recent weight loss and dietary changes. No current evidence of hyperthyroidism, but monitoring is requested. - Order TSH as part of blood work to assess current thyroid  function.     Gout Uric acid >6, will increase allopurinol  to 200mg  daily.   Hyperlipidemia Chronic, not at goal.  - Rx for crestor  daily  I personally spent a total of 34 minutes in the care of the patient today including preparing to see the patient, getting/reviewing separately obtained history, performing a medically appropriate exam/evaluation, counseling and educating, placing orders, and documenting clinical information in the EHR.   Return in about 4 weeks (around 02/17/2024).    Bernese Doffing T Jeraldin Fesler, PA-C

## 2024-01-20 NOTE — Telephone Encounter (Signed)
  FYI Only or Action Required?: FYI only for provider.  Patient was last seen in primary care on 01/09/2024.  Called Nurse Triage reporting Leg Pain.  Symptoms began today.  Interventions attempted: OTC medications: nsaids and Ice/heat application.  Symptoms are: unchanged.  Triage Disposition: See HCP Within 4 Hours (Or PCP Triage)  Patient/caregiver understands and will follow disposition?: Yes  Copied from CRM #[id number]. Topic: Clinical - Red Word Triage >> Jan 20, 2024 10:28 AM Willma R wrote: Red Word that prompted transfer to Nurse Triage: Patient states she is having intense pain down her right leg from her SI joint.  Had an appointment today fro surgical clearance but missed. Reason for Disposition  [1] SEVERE pain (e.g., excruciating, unable to do any normal activities) AND [2] not improved after 2 hours of pain medicine  Answer Assessment - Initial Assessment Questions 1. ONSET: When did the pain start?      today 2. LOCATION: Where is the pain located?      SI joint pain radiating to right leg 3. PAIN: How bad is the pain?    (Scale 1-10; or mild, moderate, severe)     9/10 4. WORK OR EXERCISE: Has there been any recent work or exercise that involved this part of the body?      denies 5. CAUSE: What do you think is causing the leg pain?     Unknown cause of flare up 6. OTHER SYMPTOMS: Do you have any other symptoms? (e.g., chest pain, back pain, breathing difficulty, swelling, rash, fever, numbness, weakness)     denies 7. PREGNANCY: Is there any chance you are pregnant? When was your last menstrual period?     N/A  Protocols used: Leg Pain-A-AH

## 2024-01-21 LAB — CBC WITH DIFFERENTIAL/PLATELET
Basophils Absolute: 0 x10E3/uL (ref 0.0–0.2)
Basos: 0 %
EOS (ABSOLUTE): 0 x10E3/uL (ref 0.0–0.4)
Eos: 0 %
Hematocrit: 40 % (ref 34.0–46.6)
Hemoglobin: 12.4 g/dL (ref 11.1–15.9)
Immature Grans (Abs): 0 x10E3/uL (ref 0.0–0.1)
Immature Granulocytes: 0 %
Lymphocytes Absolute: 2 x10E3/uL (ref 0.7–3.1)
Lymphs: 16 %
MCH: 26.1 pg — ABNORMAL LOW (ref 26.6–33.0)
MCHC: 31 g/dL — ABNORMAL LOW (ref 31.5–35.7)
MCV: 84 fL (ref 79–97)
Monocytes Absolute: 0.6 x10E3/uL (ref 0.1–0.9)
Monocytes: 5 %
Neutrophils Absolute: 9.7 x10E3/uL — ABNORMAL HIGH (ref 1.4–7.0)
Neutrophils: 79 %
Platelets: 438 x10E3/uL (ref 150–450)
RBC: 4.76 x10E6/uL (ref 3.77–5.28)
RDW: 14 % (ref 11.7–15.4)
WBC: 12.4 x10E3/uL — ABNORMAL HIGH (ref 3.4–10.8)

## 2024-01-21 LAB — COMPREHENSIVE METABOLIC PANEL WITH GFR
ALT: 28 IU/L (ref 0–32)
AST: 17 IU/L (ref 0–40)
Albumin: 4.1 g/dL (ref 3.8–4.9)
Alkaline Phosphatase: 79 IU/L (ref 44–121)
BUN/Creatinine Ratio: 30 — ABNORMAL HIGH (ref 12–28)
BUN: 39 mg/dL — ABNORMAL HIGH (ref 8–27)
Bilirubin Total: 0.4 mg/dL (ref 0.0–1.2)
CO2: 23 mmol/L (ref 20–29)
Calcium: 9.6 mg/dL (ref 8.7–10.3)
Chloride: 100 mmol/L (ref 96–106)
Creatinine, Ser: 1.29 mg/dL — ABNORMAL HIGH (ref 0.57–1.00)
Globulin, Total: 3.1 g/dL (ref 1.5–4.5)
Glucose: 84 mg/dL (ref 70–99)
Potassium: 4.5 mmol/L (ref 3.5–5.2)
Sodium: 138 mmol/L (ref 134–144)
Total Protein: 7.2 g/dL (ref 6.0–8.5)
eGFR: 48 mL/min/1.73 — ABNORMAL LOW (ref >59)

## 2024-01-21 LAB — LIPID PANEL
Chol/HDL Ratio: 4.7 ratio — ABNORMAL HIGH (ref 0.0–4.4)
Cholesterol, Total: 211 mg/dL — ABNORMAL HIGH (ref 100–199)
HDL: 45 mg/dL (ref >39)
LDL Chol Calc (NIH): 125 mg/dL — ABNORMAL HIGH (ref 0–99)
Triglycerides: 231 mg/dL — ABNORMAL HIGH (ref 0–149)
VLDL Cholesterol Cal: 41 mg/dL — ABNORMAL HIGH (ref 5–40)

## 2024-01-21 LAB — MICROALBUMIN / CREATININE URINE RATIO
Creatinine, Urine: 301.3 mg/dL
Microalb/Creat Ratio: 12 mg/g{creat} (ref 0–29)
Microalbumin, Urine: 35.4 ug/mL

## 2024-01-21 LAB — HEMOGLOBIN A1C
Est. average glucose Bld gHb Est-mCnc: 220 mg/dL
Hgb A1c MFr Bld: 9.3 % — ABNORMAL HIGH (ref 4.8–5.6)

## 2024-01-21 LAB — URIC ACID: Uric Acid: 7.8 mg/dL — ABNORMAL HIGH (ref 3.0–7.2)

## 2024-01-21 LAB — TSH: TSH: 0.647 u[IU]/mL (ref 0.450–4.500)

## 2024-01-23 ENCOUNTER — Ambulatory Visit (HOSPITAL_BASED_OUTPATIENT_CLINIC_OR_DEPARTMENT_OTHER): Payer: Self-pay | Admitting: Student

## 2024-01-23 DIAGNOSIS — Z794 Long term (current) use of insulin: Secondary | ICD-10-CM | POA: Diagnosis not present

## 2024-01-23 DIAGNOSIS — N179 Acute kidney failure, unspecified: Secondary | ICD-10-CM

## 2024-01-23 DIAGNOSIS — M5416 Radiculopathy, lumbar region: Secondary | ICD-10-CM | POA: Diagnosis not present

## 2024-01-23 DIAGNOSIS — M47817 Spondylosis without myelopathy or radiculopathy, lumbosacral region: Secondary | ICD-10-CM | POA: Diagnosis not present

## 2024-01-23 DIAGNOSIS — E119 Type 2 diabetes mellitus without complications: Secondary | ICD-10-CM | POA: Diagnosis not present

## 2024-01-23 DIAGNOSIS — M533 Sacrococcygeal disorders, not elsewhere classified: Secondary | ICD-10-CM | POA: Diagnosis not present

## 2024-01-23 DIAGNOSIS — G8929 Other chronic pain: Secondary | ICD-10-CM | POA: Diagnosis not present

## 2024-01-23 MED ORDER — ALLOPURINOL 200 MG PO TABS: 200.0000 mg | ORAL_TABLET | Freq: Every day | ORAL | 2 refills | Status: DC

## 2024-01-23 MED ORDER — DEXCOM G7 RECEIVER DEVI: 0 refills | Status: AC

## 2024-01-23 MED ORDER — AMOXICILLIN-POT CLAVULANATE 875-125 MG PO TABS: 1.0000 | ORAL_TABLET | Freq: Two times a day (BID) | ORAL | 0 refills | Status: AC

## 2024-01-23 MED ORDER — ROSUVASTATIN CALCIUM 10 MG PO TABS
10.0000 mg | ORAL_TABLET | Freq: Every day | ORAL | 2 refills | Status: DC
Start: 2024-01-23 — End: 2024-03-18

## 2024-01-23 MED ORDER — DEXCOM G7 SENSOR MISC: 6 refills | Status: AC

## 2024-01-23 NOTE — Progress Notes (Signed)
 Called patient and discussed results.  Signs of sinus infection alongside elevated WBC, sent in Augmentin  for 7 days for her.  Elevated cholesterol in the setting of type 2 diabetes, patient was agreeable to Crestor .  A1c came back at 9.3 indicating severely uncontrolled diabetes, surgical clearance denied.  Appears to have had an AKI, that we have not gotten a kidney function on her until now, so the most recent ones I have is from 5 years ago-thus AKI may or may not be accurate.  Uric acid level is elevated, we will raise her allopurinol  to 200 mg daily.  Thyroid  is fine.

## 2024-01-23 NOTE — Telephone Encounter (Signed)
 Copied from CRM [id number]. Topic: Clinical - Medication Question >> Jan 23, 2024  8:12 AM Deaijah H wrote: Reason for CRM: Patient called in wanting to know if Rothfuss or Nurse will be calling in an antibiotic from bloodwork results. Please call [phone]

## 2024-01-24 ENCOUNTER — Other Ambulatory Visit: Payer: Self-pay | Admitting: Physician Assistant

## 2024-01-24 DIAGNOSIS — M533 Sacrococcygeal disorders, not elsewhere classified: Secondary | ICD-10-CM

## 2024-01-28 ENCOUNTER — Telehealth (HOSPITAL_BASED_OUTPATIENT_CLINIC_OR_DEPARTMENT_OTHER): Payer: Self-pay

## 2024-01-28 ENCOUNTER — Encounter (HOSPITAL_BASED_OUTPATIENT_CLINIC_OR_DEPARTMENT_OTHER): Payer: Self-pay

## 2024-01-28 NOTE — Telephone Encounter (Signed)
 Called pt to get colonoscopy records. Thinks she got last one with Dr. [facility]). We will request records. She wanted to get a complete physical for and wanted papsmear but she has had a partial hyst, only has ovaries left due to fibroids. We need to add to surgical list. Please advise when we can do physical. She said it has been over 2 years.

## 2024-01-29 ENCOUNTER — Telehealth (HOSPITAL_BASED_OUTPATIENT_CLINIC_OR_DEPARTMENT_OTHER): Payer: Self-pay

## 2024-01-29 NOTE — Telephone Encounter (Signed)
 Pt advised we can do annual exam at next visit

## 2024-01-31 ENCOUNTER — Ambulatory Visit
Admission: RE | Admit: 2024-01-31 | Discharge: 2024-01-31 | Disposition: A | Discharge status: Home / Self Care | Source: Ambulatory Visit | Attending: Physician Assistant | Admitting: Physician Assistant

## 2024-01-31 DIAGNOSIS — M533 Sacrococcygeal disorders, not elsewhere classified: Secondary | ICD-10-CM | POA: Diagnosis not present

## 2024-01-31 MED ORDER — IOPAMIDOL (ISOVUE-M 200) INJECTION 41%
1.0000 mL | Freq: Once | INTRAMUSCULAR | Status: AC
  Administered 2024-01-31: 1 mL via INTRA_ARTICULAR

## 2024-01-31 MED ORDER — METHYLPREDNISOLONE ACETATE 40 MG/ML INJ SUSP (RADIOLOG
80.0000 mg | Freq: Once | INTRAMUSCULAR | Status: AC
  Administered 2024-01-31: 80 mg via INTRA_ARTICULAR

## 2024-01-31 NOTE — Discharge Instructions (Signed)

## 2024-02-06 ENCOUNTER — Ambulatory Visit (HOSPITAL_BASED_OUTPATIENT_CLINIC_OR_DEPARTMENT_OTHER)
Admission: RE | Admit: 2024-02-06 | Discharge: 2024-02-06 | Disposition: A | Discharge status: Home / Self Care | Source: Ambulatory Visit | Attending: Student | Admitting: Radiology

## 2024-02-06 ENCOUNTER — Encounter (HOSPITAL_BASED_OUTPATIENT_CLINIC_OR_DEPARTMENT_OTHER): Payer: Self-pay | Admitting: Student

## 2024-02-06 ENCOUNTER — Ambulatory Visit (INDEPENDENT_AMBULATORY_CARE_PROVIDER_SITE_OTHER): Admitting: Student

## 2024-02-06 ENCOUNTER — Encounter (HOSPITAL_BASED_OUTPATIENT_CLINIC_OR_DEPARTMENT_OTHER): Payer: Self-pay

## 2024-02-06 ENCOUNTER — Other Ambulatory Visit (HOSPITAL_BASED_OUTPATIENT_CLINIC_OR_DEPARTMENT_OTHER)

## 2024-02-06 VITALS — BP 124/88 | HR 96 | Temp 98.9°F | Resp 16 | Ht 66.0 in | Wt 266.0 lb

## 2024-02-06 DIAGNOSIS — M7121 Synovial cyst of popliteal space [Baker], right knee: Secondary | ICD-10-CM | POA: Diagnosis not present

## 2024-02-06 DIAGNOSIS — E119 Type 2 diabetes mellitus without complications: Secondary | ICD-10-CM

## 2024-02-06 DIAGNOSIS — Z0000 Encounter for general adult medical examination without abnormal findings: Secondary | ICD-10-CM | POA: Diagnosis not present

## 2024-02-06 DIAGNOSIS — M7989 Other specified soft tissue disorders: Secondary | ICD-10-CM | POA: Diagnosis not present

## 2024-02-06 DIAGNOSIS — R600 Localized edema: Secondary | ICD-10-CM

## 2024-02-06 DIAGNOSIS — Z794 Long term (current) use of insulin: Secondary | ICD-10-CM

## 2024-02-06 DIAGNOSIS — Z6841 Body Mass Index (BMI) 40.0 and over, adult: Secondary | ICD-10-CM | POA: Diagnosis not present

## 2024-02-06 MED ORDER — TIRZEPATIDE 5 MG/0.5ML ~~LOC~~ SOAJ: 5.0000 mg | SUBCUTANEOUS | 0 refills | Status: DC

## 2024-02-06 NOTE — Patient Instructions (Signed)
 It was nice to see you today!  If you have any problems before your next visit feel free to message me via MyChart (minor issues or questions) or call the office, otherwise you may reach out to schedule an office visit.  Thank you! Pau Banh, PA-C

## 2024-02-06 NOTE — Progress Notes (Unsigned)
 Complete physical exam  Patient: Elaine Blair   DOB: 1966-03-13   58 y.o. Female  MRN: [id number]  Subjective:    Chief Complaint  Patient presents with   Annual Exam    Annual exam. Getting leg cramps on right leg, just started atorvastatin and it happened around same time.     Discussed the use of AI scribe software for clinical note transcription with the patient, who gave verbal consent to proceed.  History of Present Illness   Najla Aughenbaugh is a 58 year old female who presents for Annual exam with worsening leg symptoms and swelling.  She is actively trying to manage her weight and stay healthy, mentioning that she is on a weight loss journey and is trying to stay active by walking, despite the nerve issues affecting her ability to exercise consistently. Other troubles as below:  She has been experiencing worsening symptoms related to her nerves, particularly in her legs. Recently, she underwent a nerve ablation procedure at the [facility] in [address], performed under fluoroscopy. Typically, after such procedures, her symptoms settle down, but currently, the nerves are 'just doing its own thing.'  She describes a new sensation of 'cold water running down' her leg, which she has never experienced before. This sensation is accompanied by swelling in her leg, which she manages by wearing a compression footie at night. 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 She has been using ice and heat to manage the symptoms.  She is concerned about the possibility of a blood clot due to the swelling and pain. She has a history of cellulitis and sepsis, which makes her particularly cautious about any swelling or wounds.  Her daughter, who has multiple sclerosis (MS), also receives care at the same facility, and she mentions that her daughter experiences a drop foot on the right side but is otherwise  doing well.     Most recent fall risk assessment:     No data to display           Most recent depression screenings:    01/09/2024   12:05 PM  PHQ 2/9 Scores  PHQ - 2 Score 0  PHQ- 9 Score 1   Patient Active Problem List   Diagnosis Date Noted   Carpal tunnel syndrome, right 09/20/2023   IDA (iron deficiency anemia) 03/20/2022   Hypertension secondary to other renal disorders 11/24/2018   Acquired absence of cervix 08/07/2018   Stage 3 chronic kidney disease (HCC) 04/11/2018   Mixed hyperlipidemia 02/12/2017   Chronic gout of multiple sites 02/01/2017   Gastroesophageal reflux disease without esophagitis 02/01/2017   Spondylosis of lumbar spine 02/01/2017   Primary osteoarthritis involving multiple joints 02/01/2017   Diabetes mellitus type 2, insulin dependent (HCC) 11/16/2016   HTN (hypertension) 11/16/2016   Past Medical History:  Diagnosis Date   Diabetes mellitus without complication (HCC)    Gout    History of abdominal abscess 11/16/2016   Hypertension    Osteoarthritis    Social History   Tobacco Use   Smoking status: Never    Passive exposure: Never   Smokeless tobacco: Never  Vaping Use   Vaping status: Never Used  Substance Use Topics   Alcohol use: No   Drug use: No   Allergies  Allergen Reactions   Latex Hives, Swelling and Rash    Rash       Patient  Care Team: Sufyaan Palma T, PA-C as PCP - General (Physician Assistant)   Outpatient Medications Prior to Visit  Medication Sig   ACCU-CHEK GUIDE TEST test strip 1 each by Other route 5 (five) times daily.   albuterol (VENTOLIN HFA) 108 (90 Base) MCG/ACT inhaler Inhale 2 puffs into the lungs as needed.   allopurinol  200 MG TABS Take 200 mg by mouth daily.   ascorbic acid (VITAMIN C) 500 MG tablet Take 500 mg by mouth daily.   atorvastatin (LIPITOR) 20 MG tablet Take 20 mg by mouth daily.   BELBUCA  750 MCG FILM Take 750 mcg by mouth as needed. Under tongue every 12 hours    Cholecalciferol 10 MCG (400 UNIT) CAPS Take 5,000 Units by mouth daily.   Continuous Glucose Receiver (DEXCOM G7 RECEIVER) DEVI Use alongside sensor to check blood glucose.   Continuous Glucose Sensor (DEXCOM G7 SENSOR) MISC Apply one sensor to upper arm every 10 days   glucose blood (ACCU-CHEK GUIDE TEST) test strip 1 each by Other route as needed.   HUMALOG KWIKPEN 100 UNIT/ML KwikPen Inject 15 Units into the skin as needed. 15-25 UNITS AS NEEDED, SLIDING SCALE   hydrALAZINE (APRESOLINE) 100 MG tablet Take 100 mg by mouth 3 (three) times daily.   hydrochlorothiazide (HYDRODIURIL) 50 MG tablet Take 50 mg by mouth daily.   LANTUS SOLOSTAR 100 UNIT/ML Solostar Pen Inject 25 Units into the skin as needed. SLIDING SCALE   MAGNESIUM PO Take 500 mg by mouth daily.   Misc Natural Products (BEET ROOT PO) Take 500 mg by mouth daily.   Multiple Vitamin (MULTI-VITAMIN) tablet Take 1 tablet by mouth daily.   mupirocin ointment (BACTROBAN) 2 % Apply 1 Application topically as needed.   NEURONTIN 300 MG capsule Take 300 mg by mouth 3 (three) times daily.   omeprazole (PRILOSEC) 20 MG capsule Take 20 mg by mouth daily.   rosuvastatin  (CRESTOR ) 10 MG tablet Take 1 tablet (10 mg total) by mouth daily.   tiZANidine (ZANAFLEX) 4 MG tablet Take 4 mg by mouth as needed.   triamcinolone  cream (KENALOG ) 0.1 % Apply 1 Application topically as needed. to affected area(s)   triamcinolone  ointment (KENALOG ) 0.1 % Apply 1 Application topically as needed.   [DISCONTINUED] tirzepatide  (MOUNJARO ) 2.5 MG/0.5ML Pen Inject 2.5 mg into the skin once a week.   No facility-administered medications prior to visit.    ROS  Per HPI     Objective:     BP 124/88   Pulse 96   Temp 98.9 F (37.2 C) (Oral)   Resp 16   Ht 5' 6 (1.676 m)   Wt 266 lb (120.7 kg)   SpO2 96%   BMI 42.93 kg/m  BP Readings from Last 3 Encounters:  02/06/24 124/88  01/31/24 (!) 196/105  01/20/24 117/81   Wt Readings from Last 3  Encounters:  02/06/24 266 lb (120.7 kg)  01/20/24 263 lb 1.6 oz (119.3 kg)  01/09/24 266 lb 9.6 oz (120.9 kg)      Physical Exam Constitutional:      General: She is not in acute distress.    Appearance: Normal appearance. She is not ill-appearing or diaphoretic.  HENT:     Head: Normocephalic and atraumatic.     Right Ear: Tympanic membrane, ear canal and external ear normal.     Left Ear: Tympanic membrane, ear canal and external ear normal.     Nose: Nose normal.     Mouth/Throat:     Mouth:  Mucous membranes are moist.     Pharynx: Oropharynx is clear.  Eyes:     General: No scleral icterus.       Right eye: No discharge.        Left eye: No discharge.     Extraocular Movements: Extraocular movements intact.     Conjunctiva/sclera: Conjunctivae normal.     Pupils: Pupils are equal, round, and reactive to light.  Neck:     Thyroid : No thyroid  mass, thyromegaly or thyroid  tenderness.     Vascular: No carotid bruit.  Cardiovascular:     Rate and Rhythm: Normal rate and regular rhythm.     Pulses: Normal pulses.     Heart sounds: Normal heart sounds. No murmur heard.    No friction rub. No gallop.  Pulmonary:     Effort: Pulmonary effort is normal.     Breath sounds: Normal breath sounds. No wheezing, rhonchi or rales.  Chest:     Chest wall: No tenderness.  Abdominal:     General: Bowel sounds are normal. There is no distension.     Palpations: Abdomen is soft.     Tenderness: There is no abdominal tenderness. There is no guarding.  Musculoskeletal:        General: No swelling, deformity or signs of injury.     Cervical back: Neck supple.     Right lower leg: Edema (2+) present.     Left lower leg: No edema (1+).     Right foot: No deformity or Charcot foot.     Left foot: No deformity or Charcot foot.  Feet:     Right foot:     Protective Sensation: 9 sites tested.  8 sites sensed.     Skin integrity: Skin integrity normal. No ulcer, skin breakdown, erythema or  warmth.     Toenail Condition: Right toenails are normal.     Left foot:     Protective Sensation: 9 sites tested.  9 sites sensed.     Skin integrity: Skin integrity normal. No ulcer, skin breakdown, erythema or warmth.     Toenail Condition: Left toenails are normal.     Comments: Normal pulses Multiple calluses on toes bilaterally Lymphadenopathy:     Cervical: No cervical adenopathy.     Right cervical: No superficial or posterior cervical adenopathy.    Left cervical: No superficial cervical adenopathy.  Skin:    Coloration: Skin is not jaundiced.     Findings: No rash.  Neurological:     General: No focal deficit present.     Mental Status: She is alert and oriented to person, place, and time.     Motor: No weakness.  Psychiatric:        Behavior: Behavior normal.      No results found for any visits on 02/06/24. Last CBC Lab Results  Component Value Date   WBC 12.4 (H) 01/20/2024   HGB 12.4 01/20/2024   HCT 40.0 01/20/2024   MCV 84 01/20/2024   MCH 26.1 (L) 01/20/2024   RDW 14.0 01/20/2024   PLT 438 01/20/2024   Last metabolic panel Lab Results  Component Value Date   GLUCOSE 84 01/20/2024   NA 138 01/20/2024   K 4.5 01/20/2024   CL 100 01/20/2024   CO2 23 01/20/2024   BUN 39 (H) 01/20/2024   CREATININE 1.29 (H) 01/20/2024   EGFR 48 (L) 01/20/2024   CALCIUM  9.6 01/20/2024   PROT 7.2 01/20/2024   ALBUMIN 4.1 01/20/2024  LABGLOB 3.1 01/20/2024   BILITOT 0.4 01/20/2024   ALKPHOS 79 01/20/2024   AST 17 01/20/2024   ALT 28 01/20/2024   ANIONGAP 11 06/29/2018   Last lipids Lab Results  Component Value Date   CHOL 211 (H) 01/20/2024   HDL 45 01/20/2024   LDLCALC 125 (H) 01/20/2024   TRIG 231 (H) 01/20/2024   CHOLHDL 4.7 (H) 01/20/2024   Last hemoglobin A1c Lab Results  Component Value Date   HGBA1C 9.3 (H) 01/20/2024        Assessment & Plan:    Routine Health Maintenance and Physical Exam  Health Maintenance  Topic Date Due   Medicare  Annual Wellness Visit  Never done   Eye exam for diabetics  Never done   Zoster (Shingles) Vaccine (1 of 2) Never done   Breast Cancer Screening  Never done   Colon Cancer Screening  Never done   Flu Shot  02/10/2024*   COVID-19 Vaccine (4 - 2025-26 season) 02/05/2025*   Hemoglobin A1C  07/22/2024   Yearly kidney function blood test for diabetes  01/19/2025   Yearly kidney health urinalysis for diabetes  01/19/2025   Complete foot exam   02/05/2025   DTaP/Tdap/Td vaccine (2 - Td or Tdap) 11/23/2028   Pneumococcal Vaccine for age over 57  Completed   Hepatitis C Screening  Completed   HIV Screening  Completed   Hepatitis B Vaccine  Aged Out   HPV Vaccine  Aged Out   Meningitis B Vaccine  Aged Out  *Topic was postponed. The date shown is not the original due date.    Encouraged her to engage in regular exercise appropriate for her age and condition.  Assessment and Plan    Annual Exam General health maintenance discussed, including need for routine screenings and vaccinations. No recent eye exam, colonoscopy pending, and shingles vaccine not yet administered. 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 New symptom of cold water sensation running down the leg. Differential includes nerve irritation. No redness or significant heat, but significant swelling present. Cellulitis and sepsis history increases concern for potential complications. - Order stat ultrasound of the lower extremity to rule out DVT - Continue use of compression stockings  Obesity/ T2DM Obesity with ongoing efforts for weight loss. Actively engaged in diet modifications and increased physical activity. Currently on Mounjaro  for weight management, with positive response  in terms of weight reduction and improved blood pressure. - Increase mounjaro  to 5mg  - Encourage continued diet and exercise regimen - Schedule follow-up for weigh-in and reassessment of treatment efficacy   Return in about 4 weeks (around 03/05/2024) for weigh in/mounjaro  titration.     Lameshia Hypolite T Tayari Yankee, PA-C

## 2024-02-07 ENCOUNTER — Ambulatory Visit (HOSPITAL_BASED_OUTPATIENT_CLINIC_OR_DEPARTMENT_OTHER): Payer: Self-pay | Admitting: Student

## 2024-02-17 DIAGNOSIS — M47816 Spondylosis without myelopathy or radiculopathy, lumbar region: Secondary | ICD-10-CM | POA: Diagnosis not present

## 2024-02-21 ENCOUNTER — Telehealth (HOSPITAL_BASED_OUTPATIENT_CLINIC_OR_DEPARTMENT_OTHER): Payer: Self-pay

## 2024-02-21 NOTE — Telephone Encounter (Signed)
 Pt said she called ortho and they gave her appt for fluid removal of knees. She would rather see PCP if possible for this. [facility] gave her appt on 02/25/2024. WF will be seeing her 03/04/2024.

## 2024-02-22 DIAGNOSIS — E119 Type 2 diabetes mellitus without complications: Secondary | ICD-10-CM | POA: Diagnosis not present

## 2024-02-22 DIAGNOSIS — Z794 Long term (current) use of insulin: Secondary | ICD-10-CM | POA: Diagnosis not present

## 2024-02-24 NOTE — Telephone Encounter (Signed)
 Left detailed msg for pt informing her  to have her fluid removal of her knees with ortho as advised by PCP.

## 2024-02-25 DIAGNOSIS — M1712 Unilateral primary osteoarthritis, left knee: Secondary | ICD-10-CM | POA: Diagnosis not present

## 2024-03-02 DIAGNOSIS — M47816 Spondylosis without myelopathy or radiculopathy, lumbar region: Secondary | ICD-10-CM | POA: Diagnosis not present

## 2024-03-04 DIAGNOSIS — G8929 Other chronic pain: Secondary | ICD-10-CM | POA: Diagnosis not present

## 2024-03-04 DIAGNOSIS — M25561 Pain in right knee: Secondary | ICD-10-CM | POA: Diagnosis not present

## 2024-03-04 DIAGNOSIS — M170 Bilateral primary osteoarthritis of knee: Secondary | ICD-10-CM | POA: Diagnosis not present

## 2024-03-04 DIAGNOSIS — M25562 Pain in left knee: Secondary | ICD-10-CM | POA: Diagnosis not present

## 2024-03-05 ENCOUNTER — Ambulatory Visit (INDEPENDENT_AMBULATORY_CARE_PROVIDER_SITE_OTHER): Admitting: Student

## 2024-03-05 ENCOUNTER — Encounter (HOSPITAL_BASED_OUTPATIENT_CLINIC_OR_DEPARTMENT_OTHER): Payer: Self-pay | Admitting: Student

## 2024-03-05 VITALS — BP 126/88 | HR 101 | Temp 98.6°F | Resp 16 | Ht 66.0 in | Wt 263.8 lb

## 2024-03-05 DIAGNOSIS — E079 Disorder of thyroid, unspecified: Secondary | ICD-10-CM | POA: Diagnosis not present

## 2024-03-05 DIAGNOSIS — I10 Essential (primary) hypertension: Secondary | ICD-10-CM | POA: Diagnosis not present

## 2024-03-05 DIAGNOSIS — Z794 Long term (current) use of insulin: Secondary | ICD-10-CM

## 2024-03-05 DIAGNOSIS — E119 Type 2 diabetes mellitus without complications: Secondary | ICD-10-CM

## 2024-03-05 DIAGNOSIS — Z6841 Body Mass Index (BMI) 40.0 and over, adult: Secondary | ICD-10-CM

## 2024-03-05 MED ORDER — TIRZEPATIDE 7.5 MG/0.5ML ~~LOC~~ SOAJ: 7.5000 mg | SUBCUTANEOUS | 1 refills | Status: DC

## 2024-03-05 NOTE — Patient Instructions (Signed)
 It was nice to see you today!  If you have any problems before your next visit feel free to message me via MyChart (minor issues or questions) or call the office, otherwise you may reach out to schedule an office visit.  Thank you! Pau Banh, PA-C

## 2024-03-05 NOTE — Progress Notes (Signed)
 Established Patient Office Visit  Subjective   Patient ID: Elaine Blair, female    DOB: Jul 14, 1965  Age: 58 y.o. MRN: [id number]  Chief Complaint  Patient presents with   Medical Management of Chronic Issues    Follow up.    HPI  Discussed the use of AI scribe software for clinical note transcription with the patient, who gave verbal consent to proceed.  History of Present Illness   Elaine Blair is a 58 year old female with diabetes who presents for management of blood sugar and weight control.  She is currently on Mounjaro , with a recent dose of 5 mg. Her blood sugar levels have been fluctuating, with a recent reading of 130 mg/dL in the morning and 223 mg/dL upon arrival at the clinic. She attributes some of the fluctuations to recent steroid injections in her knees, which she received yesterday. She is concerned that these injections may temporarily elevate her A1c levels.  She has been experiencing knee pain and inflammation, which she feels is affecting her overall health and ability to exercise. The inflammation is impacting her ability to engage in physical activities, such as walking downtown, which she enjoys. She is contemplating knee surgery to address these issues.  She has a history of nerve pain and recently underwent a left-sided nerve operation. She continues to experience some numbness and is considering further interventions, such as a neurostimulator, due to frequent steroid injections for pain management.  Her weight has been fluctuating, with recent weights of 259 lbs at home and 263 lbs at the clinic. She is actively trying to manage her weight through diet and exercise, reporting a recent weight loss to 259 lbs before it increased again. She is committed to a healthy diet, avoiding sugar and unhealthy foods, and is determined to maintain self-control over her eating habits.  She has a history of sepsis, which she experienced from Thanksgiving to April in  a previous year, and attributes part of the difficulty in managing her blood sugar to this past infection. She is currently using Humalog and Mounjaro  for diabetes management, and her blood sugar has been dropping significantly, requiring her to adjust her insulin use.  She reports a history of an overactive thyroid , diagnosed in [place], with symptoms of heart racing and sweating. Although her thyroid  levels were normal at the last check, she feels that her thyroid  may be acting up again, as she has been experiencing similar symptoms recently. She is concerned about her white blood cell count due to past infections and requests a CBC to check for any abnormalities.       Patient Active Problem List   Diagnosis Date Noted   Carpal tunnel syndrome, right 09/20/2023   IDA (iron deficiency anemia) 03/20/2022   Hypertension secondary to other renal disorders 11/24/2018   Acquired absence of cervix 08/07/2018   Stage 3 chronic kidney disease (HCC) 04/11/2018   Mixed hyperlipidemia 02/12/2017   Chronic gout of multiple sites 02/01/2017   Gastroesophageal reflux disease without esophagitis 02/01/2017   Spondylosis of lumbar spine 02/01/2017   Primary osteoarthritis involving multiple joints 02/01/2017   Diabetes mellitus type 2, insulin dependent (HCC) 11/16/2016   HTN (hypertension) 11/16/2016   Past Medical History:  Diagnosis Date   Diabetes mellitus without complication (HCC)    Gout    History of abdominal abscess 11/16/2016   Hypertension    Osteoarthritis    Past Surgical History:  Procedure Laterality Date   CESAREAN SECTION  x2   SHOULDER SURGERY     shoulder repair   TOTAL ABDOMINAL HYSTERECTOMY Bilateral    Social History   Tobacco Use   Smoking status: Never    Passive exposure: Never   Smokeless tobacco: Never  Vaping Use   Vaping status: Never Used  Substance Use Topics   Alcohol use: No   Drug use: No   Allergies  Allergen Reactions   Latex Hives,  Swelling and Rash    Rash       ROS Per HPI.    Objective:     BP (!) 165/108   Pulse (!) 101   Temp 98.6 F (37 C) (Oral)   Resp 16   Ht 5' 6 (1.676 m)   Wt 263 lb 12.8 oz (119.7 kg)   SpO2 95%   BMI 42.58 kg/m  BP Readings from Last 3 Encounters:  03/05/24 (!) 165/108  02/06/24 124/88  01/31/24 (!) 196/105   Wt Readings from Last 3 Encounters:  03/05/24 263 lb 12.8 oz (119.7 kg)  02/06/24 266 lb (120.7 kg)  01/20/24 263 lb 1.6 oz (119.3 kg)      Physical Exam Constitutional:      General: She is not in acute distress.    Appearance: Normal appearance. She is obese. She is not ill-appearing.  HENT:     Head: Normocephalic and atraumatic.     Nose: Nose normal.  Eyes:     General: No scleral icterus.    Conjunctiva/sclera: Conjunctivae normal.  Cardiovascular:     Rate and Rhythm: Normal rate and regular rhythm.     Heart sounds: Normal heart sounds. No murmur heard.    No friction rub.  Pulmonary:     Effort: Pulmonary effort is normal. No respiratory distress.     Breath sounds: Normal breath sounds. No wheezing, rhonchi or rales.  Musculoskeletal:        General: Normal range of motion.  Skin:    General: Skin is warm and dry.     Coloration: Skin is not jaundiced or pale.  Neurological:     General: No focal deficit present.     Mental Status: She is alert.  Psychiatric:        Mood and Affect: Mood normal.        Behavior: Behavior normal.      No results found for any visits on 03/05/24.  Last CBC Lab Results  Component Value Date   WBC 12.4 (H) 01/20/2024   HGB 12.4 01/20/2024   HCT 40.0 01/20/2024   MCV 84 01/20/2024   MCH 26.1 (L) 01/20/2024   RDW 14.0 01/20/2024   PLT 438 01/20/2024   Last metabolic panel Lab Results  Component Value Date   GLUCOSE 84 01/20/2024   NA 138 01/20/2024   K 4.5 01/20/2024   CL 100 01/20/2024   CO2 23 01/20/2024   BUN 39 (H) 01/20/2024   CREATININE 1.29 (H) 01/20/2024   EGFR 48 (L)  01/20/2024   CALCIUM  9.6 01/20/2024   PROT 7.2 01/20/2024   ALBUMIN 4.1 01/20/2024   LABGLOB 3.1 01/20/2024   BILITOT 0.4 01/20/2024   ALKPHOS 79 01/20/2024   AST 17 01/20/2024   ALT 28 01/20/2024   ANIONGAP 11 06/29/2018   Last lipids Lab Results  Component Value Date   CHOL 211 (H) 01/20/2024   HDL 45 01/20/2024   LDLCALC 125 (H) 01/20/2024   TRIG 231 (H) 01/20/2024   CHOLHDL 4.7 (H) 01/20/2024   Last  hemoglobin A1c Lab Results  Component Value Date   HGBA1C 9.3 (H) 01/20/2024      The 10-year ASCVD risk score (Arnett DK, et al., 2019) is: 34.6%    Assessment & Plan:   Assessment and Plan    Type 2 diabetes mellitus with Morbid obesity (BMI 42.58) Chronic, not at goal. Type 2 diabetes mellitus with obesity, managed with Mounjaro  and dietary modifications. Recent steroid injections for knee pain have likely contributed to elevated blood glucose levels. Current weight fluctuates between 259 and 263 pounds. Blood glucose levels have been variable, with recent improvement. She is motivated to continue weight loss and improve blood sugar control. TIR was only around 56%, need to continue to work on mounjaro . - Increase Mounjaro  dose from 5 mg to 7.5 mg - Reassess in one month to determine if further dose adjustment is needed - Monitor blood glucose levels regularly - Encourage continued dietary modifications and physical activity  Hypertension Chronic, not at goal. Hypertension, currently well-controlled at home with recent readings averaging around 120/91 mmHg. Blood pressure was elevated during today's visit, likely due to stress from multiple recent medical appointments. - Recheck blood pressure during today's visit - Continue monitoring blood pressure at home  Possible thyroid  dysfunction Possible thyroid  dysfunction with symptoms including heart racing and fatigue. Previous evaluations showed normal thyroid  function. She suspects overactivity due to symptoms similar  to previous episodes. - Order thyroid  function tests to assess current thyroid  activity - Consider CBC to evaluate white blood cell count if infection is suspected     Return in about 4 weeks (around 04/02/2024) for DM- mounjaro  titration.    Dajaun Goldring T Lashayla Armes, PA-C

## 2024-03-06 ENCOUNTER — Telehealth (HOSPITAL_BASED_OUTPATIENT_CLINIC_OR_DEPARTMENT_OTHER): Payer: Self-pay | Admitting: Student

## 2024-03-06 ENCOUNTER — Ambulatory Visit (HOSPITAL_BASED_OUTPATIENT_CLINIC_OR_DEPARTMENT_OTHER): Payer: Self-pay | Admitting: Student

## 2024-03-06 ENCOUNTER — Ambulatory Visit: Payer: Self-pay

## 2024-03-06 ENCOUNTER — Other Ambulatory Visit (HOSPITAL_BASED_OUTPATIENT_CLINIC_OR_DEPARTMENT_OTHER): Payer: Self-pay | Admitting: Student

## 2024-03-06 DIAGNOSIS — J329 Chronic sinusitis, unspecified: Secondary | ICD-10-CM

## 2024-03-06 DIAGNOSIS — D72828 Other elevated white blood cell count: Secondary | ICD-10-CM

## 2024-03-06 LAB — CBC WITH DIFFERENTIAL/PLATELET
Basophils Absolute: 0 x10E3/uL (ref 0.0–0.2)
Basos: 0 %
EOS (ABSOLUTE): 0 x10E3/uL (ref 0.0–0.4)
Eos: 0 %
Hematocrit: 37.8 % (ref 34.0–46.6)
Hemoglobin: 11.8 g/dL (ref 11.1–15.9)
Immature Grans (Abs): 0 x10E3/uL (ref 0.0–0.1)
Immature Granulocytes: 0 %
Lymphocytes Absolute: 1.6 x10E3/uL (ref 0.7–3.1)
Lymphs: 10 %
MCH: 26.4 pg — ABNORMAL LOW (ref 26.6–33.0)
MCHC: 31.2 g/dL — ABNORMAL LOW (ref 31.5–35.7)
MCV: 85 fL (ref 79–97)
Monocytes Absolute: 0.4 x10E3/uL (ref 0.1–0.9)
Monocytes: 3 %
Neutrophils Absolute: 14.3 x10E3/uL — ABNORMAL HIGH (ref 1.4–7.0)
Neutrophils: 87 %
Platelets: 426 x10E3/uL (ref 150–450)
RBC: 4.47 x10E6/uL (ref 3.77–5.28)
RDW: 14.6 % (ref 11.7–15.4)
WBC: 16.4 x10E3/uL — ABNORMAL HIGH (ref 3.4–10.8)

## 2024-03-06 LAB — TSH+FREE T4
Free T4: 1.38 ng/dL (ref 0.82–1.77)
TSH: 0.275 u[IU]/mL — ABNORMAL LOW (ref 0.450–4.500)

## 2024-03-06 MED ORDER — AMOXICILLIN-POT CLAVULANATE 875-125 MG PO TABS: 1.0000 | ORAL_TABLET | Freq: Two times a day (BID) | ORAL | 0 refills | Status: AC

## 2024-03-06 NOTE — Telephone Encounter (Signed)
 FYI Only or Action Required?: Action required by provider: Request for antibiotics.  Patient was last seen in primary care on 03/05/2024 by Rothfuss, Lang DASEN, PA-C.  Called Nurse Triage reporting Headache.  Symptoms began yesterday.  Interventions attempted: OTC medications: Advil sinus and cold.  Symptoms are: gradually worsening.  Triage Disposition: See Physician Within 24 Hours  Patient/caregiver understands and will follow disposition?: Unsure      Copied from CRM #[id number]. Topic: Clinical - Red Word Triage >> Mar 06, 2024 12:19 PM Willma R wrote: Red Word that prompted transfer to Nurse Triage: Patient as just in yesterday and states today she is still having pressure in her head and a bad headache. Reason for Disposition  [1] MODERATE headache (e.g., interferes with normal activities) AND [2] present > 24 hours AND [3] unexplained  (Exceptions: Pain medicines not tried, typical migraine, or headache part of viral illness.)  Answer Assessment - Initial Assessment Questions Patient requesting an antibiotic for symptoms. Patient seen in office yesterday and states she feels like she has inflammation in her body. She states her thyroid  and white blood count is off and symptoms feel like a sinus headache. Took Advil sinus and cold for symptoms. Preferred pharmacy below and requesting a call back regarding her request.    [id number] [place], [place] - [address] AT [facility] [address]  [address], [place] [address]    1. LOCATION: Where does it hurt?      In temple area going to sinus area  2. ONSET: When did the headache start? (e.g., minutes, hours, days)      Yesterday morning  3. PATTERN: Does the pain come and go, or has it been constant since it started?     Comes and goes  4. SEVERITY: How bad is the pain? and What does it keep you from doing?  (e.g., Scale 1-10; mild, moderate, or severe)      Constant pain moderate in nature     5. CAUSE: What do you think is causing the headache?     Inflammation in body when getting knee drained of fluid  6. OTHER SYMPTOMS: Do you have any other symptoms? (e.g., fever, stiff neck, eye pain, sore throat, cold symptoms)     Stuffy nose and sinus pressure  Protocols used: Headache-A-AH

## 2024-03-06 NOTE — Telephone Encounter (Signed)
 Copied from CRM [id number]. Topic: Appointments - Scheduling Inquiry for Clinic >> Mar 06, 2024 12:17 PM Willma R wrote: Reason for CRM: Patient is looking to schedule a lab appointment, [place] lab not populating in the decision tree to schedule.  Patient can be reached at [phone]

## 2024-03-09 NOTE — Progress Notes (Signed)
 Elaine Blair                                          MRN: [id number]   03/09/2024   The [facility] Quality Team Specialist reviewed this patient medical record for the purposes of chart review for care gap closure. The following were reviewed: chart review for care gap closure-glycemic status assessment.    [facility] Quality Team

## 2024-03-09 NOTE — Telephone Encounter (Signed)
Pt advised and voices understanding.

## 2024-03-10 DIAGNOSIS — M533 Sacrococcygeal disorders, not elsewhere classified: Secondary | ICD-10-CM | POA: Diagnosis not present

## 2024-03-13 ENCOUNTER — Other Ambulatory Visit (HOSPITAL_BASED_OUTPATIENT_CLINIC_OR_DEPARTMENT_OTHER)

## 2024-03-13 ENCOUNTER — Other Ambulatory Visit (HOSPITAL_BASED_OUTPATIENT_CLINIC_OR_DEPARTMENT_OTHER): Payer: Self-pay

## 2024-03-13 DIAGNOSIS — D72828 Other elevated white blood cell count: Secondary | ICD-10-CM

## 2024-03-13 LAB — CBC WITH DIFFERENTIAL/PLATELET
Basophils Absolute: 0.1 x10E3/uL (ref 0.0–0.2)
Basos: 0 %
EOS (ABSOLUTE): 0.2 x10E3/uL (ref 0.0–0.4)
Eos: 2 %
Hematocrit: 37.6 % (ref 34.0–46.6)
Hemoglobin: 11.8 g/dL (ref 11.1–15.9)
Immature Grans (Abs): 0 x10E3/uL (ref 0.0–0.1)
Immature Granulocytes: 0 %
Lymphocytes Absolute: 3.7 x10E3/uL — ABNORMAL HIGH (ref 0.7–3.1)
Lymphs: 27 %
MCH: 26.5 pg — ABNORMAL LOW (ref 26.6–33.0)
MCHC: 31.4 g/dL — ABNORMAL LOW (ref 31.5–35.7)
MCV: 84 fL (ref 79–97)
Monocytes Absolute: 0.9 x10E3/uL (ref 0.1–0.9)
Monocytes: 7 %
Neutrophils Absolute: 8.5 x10E3/uL — ABNORMAL HIGH (ref 1.4–7.0)
Neutrophils: 64 %
Platelets: 406 x10E3/uL (ref 150–450)
RBC: 4.46 x10E6/uL (ref 3.77–5.28)
RDW: 14.5 % (ref 11.7–15.4)
WBC: 13.4 x10E3/uL — ABNORMAL HIGH (ref 3.4–10.8)

## 2024-03-16 ENCOUNTER — Ambulatory Visit (HOSPITAL_BASED_OUTPATIENT_CLINIC_OR_DEPARTMENT_OTHER)
Admission: RE | Admit: 2024-03-16 | Discharge: 2024-03-16 | Disposition: A | Discharge status: Home / Self Care | Attending: Family Medicine | Admitting: Family Medicine

## 2024-03-16 ENCOUNTER — Other Ambulatory Visit (HOSPITAL_BASED_OUTPATIENT_CLINIC_OR_DEPARTMENT_OTHER): Payer: Self-pay

## 2024-03-16 ENCOUNTER — Encounter (HOSPITAL_BASED_OUTPATIENT_CLINIC_OR_DEPARTMENT_OTHER): Payer: Self-pay

## 2024-03-16 ENCOUNTER — Ambulatory Visit (HOSPITAL_BASED_OUTPATIENT_CLINIC_OR_DEPARTMENT_OTHER): Payer: Self-pay | Admitting: Student

## 2024-03-16 VITALS — BP 164/102 | HR 86 | Temp 98.6°F | Resp 20

## 2024-03-16 DIAGNOSIS — Z8619 Personal history of other infectious and parasitic diseases: Secondary | ICD-10-CM

## 2024-03-16 DIAGNOSIS — D72829 Elevated white blood cell count, unspecified: Secondary | ICD-10-CM | POA: Diagnosis not present

## 2024-03-16 MED ORDER — DOXYCYCLINE HYCLATE 100 MG PO CAPS
100.0000 mg | ORAL_CAPSULE | Freq: Two times a day (BID) | ORAL | 0 refills | Status: DC
  Filled 2024-03-16: qty 14, 7d supply, fill #0

## 2024-03-16 NOTE — ED Triage Notes (Signed)
 Patient has recently been seen by Lang SAUNDERS., Pa-C. Her thyroid  levels were a little off causing her heart rate to increase. Her WBC count was elevated. Patient has had several injections from her pain management doctor and concerned that she has a bacterial infection. States she has been admitted for sepsis in the past and doesn't want it to get that bad again. Was encouraged to go to ER and patient thought Urgent Care was an ER. States her reason for coming in is to get on an antibiotic. Has taken doxycycline in the past and states after that, her numbers, all come back to normal.

## 2024-03-16 NOTE — ED Provider Notes (Signed)
 [facility]    CSN: [id number] Arrival date & time: 03/16/24  1513      History   Chief Complaint Chief Complaint  Patient presents with   Follow-up    Nausea/white blood count  elevated need and was told to come to urgent care due to symptoms - Entered by patient    HPI Elaine Blair is a 58 y.o. female.   58 year old female reports a history of sepsis that almost killed her many years ago.  Since that time she has had intermittent infections and was seeing a provider in [place] associated with the [facility] system.  She reports that she was followed for years and would intermittently have white blood cell counts checked and be put on doxycycline or clindamycin for previous septic infections.  Sometimes her infection was at a joint injection site sometimes her infection was a small abscess under the skin.  She is very afraid of sepsis and infection.  She felt like she had an infection and that her heart rate was racing and that she was sick and she saw primary care on 03/05/2024.  She had a white blood cell count of 16.  She was put on antibiotics as a result of the elevated white blood cell count and as a treatment for possible sinusitis.  She had follow-up blood work on 03/13/2024 and her white blood cell count was down to 13.  Her provider messaged her today that additional antibiotics were not needed and that if she felt like she had an infection she would need to be reevaluated.  He had to leave the office at lunch and is not able to see her this afternoon.  She has come to urgent care seeking an antibiotic because she feels like she has an infection.  She has had SI joint injections in her lower back the last one was several weeks ago possibly 4 to 6 weeks ago.  On 03/10/2024, she had Kenalog  40 mg injected as her most recent SI joint injection.  She feels that she has some kind of infection and wants an antibiotic.  She is reporting nausea but she is also on Mounjaro  for  weight loss and diabetes.     Past Medical History:  Diagnosis Date   Diabetes mellitus without complication (HCC)    Gout    History of abdominal abscess 11/16/2016   Hypertension    Osteoarthritis     Patient Active Problem List   Diagnosis Date Noted   Carpal tunnel syndrome, right 09/20/2023   IDA (iron deficiency anemia) 03/20/2022   Hypertension secondary to other renal disorders 11/24/2018   Acquired absence of cervix 08/07/2018   Stage 3 chronic kidney disease (HCC) 04/11/2018   Mixed hyperlipidemia 02/12/2017   Chronic gout of multiple sites 02/01/2017   Gastroesophageal reflux disease without esophagitis 02/01/2017   Spondylosis of lumbar spine 02/01/2017   Primary osteoarthritis involving multiple joints 02/01/2017   Diabetes mellitus type 2, insulin dependent (HCC) 11/16/2016   HTN (hypertension) 11/16/2016    Past Surgical History:  Procedure Laterality Date   CESAREAN SECTION     x2   SHOULDER SURGERY     shoulder repair   TOTAL ABDOMINAL HYSTERECTOMY Bilateral     OB History   No obstetric history on file.      Home Medications    Prior to Admission medications   Medication Sig Start Date End Date Taking? Authorizing Provider  doxycycline (VIBRAMYCIN) 100 MG capsule Take 1 capsule (  100 mg total) by mouth 2 (two) times daily for 7 days. 03/16/24 03/23/24 Yes Ival Domino, FNP  ACCU-CHEK GUIDE TEST test strip 1 each by Other route 5 (five) times daily.    [provider]  albuterol (VENTOLIN HFA) 108 (90 Base) MCG/ACT inhaler Inhale 2 puffs into the lungs as needed. 02/28/21   [provider]  allopurinol  200 MG TABS Take 200 mg by mouth daily. 01/23/24   Rothfuss, Jacob T, PA-C  ascorbic acid (VITAMIN C) 500 MG tablet Take 500 mg by mouth daily. 04/01/17   [provider]  BELBUCA  750 MCG FILM Take 750 mcg by mouth as needed. Under tongue every 12 hours    [provider]  Cholecalciferol 10 MCG (400 UNIT) CAPS  Take 5,000 Units by mouth daily. 02/28/21   [provider]  Continuous Glucose Receiver (DEXCOM G7 RECEIVER) DEVI Use alongside sensor to check blood glucose. 01/23/24   Rothfuss, Jacob T, PA-C  Continuous Glucose Sensor (DEXCOM G7 SENSOR) MISC Apply one sensor to upper arm every 10 days 01/23/24   Rothfuss, Jacob T, PA-C  glucose blood (ACCU-CHEK GUIDE TEST) test strip 1 each by Other route as needed. 12/27/22   [provider]  HUMALOG KWIKPEN 100 UNIT/ML KwikPen Inject 15 Units into the skin as needed. 15-25 UNITS AS NEEDED, SLIDING SCALE 03/01/21   [provider]  hydrALAZINE (APRESOLINE) 100 MG tablet Take 100 mg by mouth 3 (three) times daily. 10/06/16   [provider]  hydrochlorothiazide (HYDRODIURIL) 50 MG tablet Take 50 mg by mouth daily. 12/19/16   [provider]  ibuprofen (ADVIL) 800 MG tablet Take 800 mg by mouth 3 (three) times daily. Patient taking differently: Take 800 mg by mouth as needed. 12/08/23   [provider]  LANTUS SOLOSTAR 100 UNIT/ML Solostar Pen Inject 25 Units into the skin as needed. SLIDING SCALE    [provider]  MAGNESIUM PO Take 500 mg by mouth daily.    [provider]  Misc Natural Products (BEET ROOT PO) Take 500 mg by mouth daily.    [provider]  Multiple Vitamin (MULTI-VITAMIN) tablet Take 1 tablet by mouth daily. 10/31/21   [provider]  mupirocin ointment (BACTROBAN) 2 % Apply 1 Application topically as needed.    [provider]  NEURONTIN 300 MG capsule Take 300 mg by mouth 3 (three) times daily. 01/11/24   [provider]  Omega-3 Fatty Acids (FISH OIL PO) Take 200 mg by mouth daily.    [provider]  omeprazole (PRILOSEC) 20 MG capsule Take 20 mg by mouth daily. 02/28/21   [provider]  rosuvastatin  (CRESTOR ) 10 MG tablet Take 1 tablet (10 mg total) by mouth daily. 01/23/24   Rothfuss, Jacob T, PA-C  tirzepatide  (MOUNJARO )  7.5 MG/0.5ML Pen Inject 7.5 mg into the skin once a week. 03/05/24   Rothfuss, Jacob T, PA-C  tiZANidine (ZANAFLEX) 4 MG tablet Take 4 mg by mouth as needed. 01/17/24   [provider]  triamcinolone  cream (KENALOG ) 0.1 % Apply 1 Application topically as needed. to affected area(s) 01/02/23   [provider]    Family History Family History  Problem Relation Age of Onset   Hypertension Mother    Diabetes Mother    Tuberculosis Father     Social History Social History   Tobacco Use   Smoking status: Never    Passive exposure: Never   Smokeless tobacco: Never  Vaping Use  Vaping status: Never Used  Substance Use Topics   Alcohol use: No   Drug use: No     Allergies   Latex   Review of Systems Review of Systems  Constitutional:  Negative for chills and fever.  HENT:  Negative for ear pain and sore throat.   Eyes:  Negative for pain and visual disturbance.  Respiratory:  Negative for cough and shortness of breath.   Cardiovascular:  Negative for chest pain and palpitations.  Gastrointestinal:  Positive for nausea and vomiting. Negative for abdominal pain, constipation and diarrhea.  Genitourinary:  Negative for dysuria and hematuria.  Musculoskeletal:  Positive for arthralgias. Negative for back pain.  Skin:  Negative for color change and rash.  Neurological:  Negative for seizures and syncope.  All other systems reviewed and are negative.    Physical Exam Triage Vital Signs ED Triage Vitals  Encounter Vitals Group     BP 03/16/24 1541 (!) 164/102     Girls Systolic BP Percentile --      Girls Diastolic BP Percentile --      Boys Systolic BP Percentile --      Boys Diastolic BP Percentile --      Pulse Rate 03/16/24 1541 86     Resp 03/16/24 1541 20     Temp 03/16/24 1541 98.6 F (37 C)     Temp src --      SpO2 03/16/24 1541 93 %     Weight --      Height --      Head Circumference --      Peak Flow --      Pain Score 03/16/24 1553 0      Pain Loc --      Pain Education --      Exclude from Growth Chart --    No data found.  Updated Vital Signs BP (!) 164/102 (BP Location: Left Arm)   Pulse 86   Temp 98.6 F (37 C)   Resp 20   SpO2 93%   Visual Acuity Right Eye Distance:   Left Eye Distance:   Bilateral Distance:    Right Eye Near:   Left Eye Near:    Bilateral Near:     Physical Exam Vitals and nursing note reviewed.  Constitutional:      General: She is not in acute distress.    Appearance: She is well-developed. She is not ill-appearing or toxic-appearing.  HENT:     Head: Normocephalic and atraumatic.     Right Ear: Hearing, tympanic membrane, ear canal and external ear normal.     Left Ear: Hearing, tympanic membrane, ear canal and external ear normal.     Nose: No congestion or rhinorrhea.     Right Sinus: No maxillary sinus tenderness or frontal sinus tenderness.     Left Sinus: No maxillary sinus tenderness or frontal sinus tenderness.     Mouth/Throat:     Lips: Pink.     Mouth: Mucous membranes are moist.     Pharynx: Uvula midline. No oropharyngeal exudate or posterior oropharyngeal erythema.     Tonsils: No tonsillar exudate.  Eyes:     Conjunctiva/sclera: Conjunctivae normal.     Pupils: Pupils are equal, round, and reactive to light.  Cardiovascular:     Rate and Rhythm: Normal rate and regular rhythm.     Heart sounds: S1 normal and S2 normal. No murmur heard. Pulmonary:     Effort: Pulmonary effort is normal. No  respiratory distress.     Breath sounds: Normal breath sounds. No decreased breath sounds, wheezing, rhonchi or rales.  Abdominal:     General: Bowel sounds are normal.     Palpations: Abdomen is soft.     Tenderness: There is no abdominal tenderness.  Musculoskeletal:        General: No swelling.     Cervical back: Neck supple.  Lymphadenopathy:     Head:     Right side of head: No submental, submandibular, tonsillar, preauricular or posterior auricular adenopathy.      Left side of head: No submental, submandibular, tonsillar, preauricular or posterior auricular adenopathy.     Cervical: No cervical adenopathy.     Right cervical: No superficial cervical adenopathy.    Left cervical: No superficial cervical adenopathy.  Skin:    General: Skin is warm and dry.     Capillary Refill: Capillary refill takes less than 2 seconds.     Findings: Wound (See comments and photos for more information.) present. No rash.     Comments: Patient had an injection in her left leg above the knee and she has had injections in her left and right lower back.  Sites are without erythema, edema, induration or exudate.  See photos for more information.  Neurological:     Mental Status: She is alert and oriented to person, place, and time.  Psychiatric:        Mood and Affect: Mood normal.         UC Treatments / Results  Labs (all labs ordered are listed, but only abnormal results are displayed) CBC with Differential/Platelet: 03/13/2024:    Component Ref Range & Units (hover) 3 d ago  WBC 13.4 High   RBC 4.46  Hemoglobin 11.8  Hematocrit 37.6  MCV 84  MCH 26.5 Low   MCHC 31.4 Low   RDW 14.5  Platelets 406  Neutrophils 64  Lymphs 27  Monocytes 7  Eos 2  Basos 0  Neutrophils Absolute 8.5 High   Lymphocytes Absolute 3.7 High   Monocytes Absolute 0.9  EOS (ABSOLUTE) 0.2  Basophils Absolute 0.1  Immature Granulocytes 0  Immature Grans (Abs) 0.0  nRBC   Lymphocytes Relative   Monocytes Relative   Monocytes Absolute   Eosinophils Relative   Eosinophils Absolute   Basophils Relative   Abs Immature Granulocytes   Resulting Agency [id number]      EKG   Radiology No results found.  Procedures Procedures (including critical care time)  Medications Ordered in UC Medications - No data to display  Initial Impression / Assessment and Plan / UC Course  I have reviewed the triage vital signs and the nursing notes.  Pertinent labs & imaging results  that were available during my care of the patient were reviewed by me and considered in my medical decision making (see chart for details).   Plan of Care: Leukocytosis and history of sepsis: White cell blood count was 16 on 03/05/2024.  The white blood cell count was 13 on 03/13/2024.  CBC with differential drawn and sent today.  Will adjust the plan of care, if needed once the CBC results.  Doxycycline 100 mg twice daily for 7 days.  Patient needs to see primary care and discuss chronic leukocytosis and concerns for chronic infections versus risk of sepsis.  Patient most likely needs to see infectious disease for further workup and management.  Follow-up with primary care.  I reviewed the plan of care with the patient  and/or the patient's guardian.  The patient and/or guardian had time to ask questions and acknowledged that the questions were answered.  I provided instruction on symptoms or reasons to return here or to go to an ER, if symptoms/condition did not improve, worsened or if new symptoms occurred.   I spent over 30 minutes on patient care, including face to face time and care planning/patient management.  Additional time spent in discussion of her lab results, possible causes of her abnormal results, possible additional workup, possible treatment, and possible referral to specialist. Final Clinical Impressions(s) / UC Diagnoses   Final diagnoses:  Leukocytosis, unspecified type  History of sepsis     Discharge Instructions      Leukocytosis and history of sepsis: White cell blood count was 16 on 03/05/2024.  The white blood cell count was 13 on 03/13/2024.  CBC with differential drawn and sent today.  Will adjust the plan of care, if needed once the CBC results.  Doxycycline 100 mg twice daily for 7 days.  Patient needs to see primary care and discuss chronic leukocytosis and concerns for chronic infections versus risk of sepsis.  Patient most likely needs to see infectious disease  for further workup and management.  Follow-up with primary care.     ED Prescriptions     Medication Sig Dispense Auth. Provider   doxycycline (VIBRAMYCIN) 100 MG capsule Take 1 capsule (100 mg total) by mouth 2 (two) times daily for 7 days. 14 capsule Ival Domino, FNP      PDMP not reviewed this encounter.   Ival Domino, FNP 03/16/24 [phone]

## 2024-03-16 NOTE — Discharge Instructions (Addendum)
 Leukocytosis and history of sepsis: White cell blood count was 16 on 03/05/2024.  The white blood cell count was 13 on 03/13/2024.  CBC with differential drawn and sent today.  Will adjust the plan of care, if needed once the CBC results.  Doxycycline 100 mg twice daily for 7 days.  Patient needs to see primary care and discuss chronic leukocytosis and concerns for chronic infections versus risk of sepsis.  Patient most likely needs to see infectious disease for further workup and management.  Follow-up with primary care.

## 2024-03-16 NOTE — ED Notes (Signed)
 Blood drawn by provider.

## 2024-03-16 NOTE — Telephone Encounter (Signed)
 Pt advised and voices understanding.  Pt said she is still sweating and does not feel bad at all. Said in past Pcp would send her in an antibiotic because it was usual for her. Said it can be seen in hx. I advised pt we would be risking sepsis or other complications and she should go to ER. Pt said she will be coming to the UC here at [facility].

## 2024-03-17 ENCOUNTER — Encounter (HOSPITAL_COMMUNITY): Payer: Self-pay

## 2024-03-17 ENCOUNTER — Telehealth (HOSPITAL_BASED_OUTPATIENT_CLINIC_OR_DEPARTMENT_OTHER): Payer: Self-pay | Admitting: Student

## 2024-03-17 ENCOUNTER — Ambulatory Visit (HOSPITAL_COMMUNITY): Payer: Self-pay

## 2024-03-17 ENCOUNTER — Observation Stay (HOSPITAL_COMMUNITY)
Admission: EM | Admit: 2024-03-17 | Discharge: 2024-03-19 | Disposition: A | Discharge status: Home / Self Care | Attending: Internal Medicine | Admitting: Internal Medicine

## 2024-03-17 ENCOUNTER — Emergency Department (HOSPITAL_COMMUNITY)

## 2024-03-17 DIAGNOSIS — Z23 Encounter for immunization: Secondary | ICD-10-CM | POA: Insufficient documentation

## 2024-03-17 DIAGNOSIS — R000 Tachycardia, unspecified: Secondary | ICD-10-CM | POA: Diagnosis not present

## 2024-03-17 DIAGNOSIS — I2693 Single subsegmental pulmonary embolism without acute cor pulmonale: Principal | ICD-10-CM | POA: Insufficient documentation

## 2024-03-17 DIAGNOSIS — K449 Diaphragmatic hernia without obstruction or gangrene: Secondary | ICD-10-CM | POA: Diagnosis not present

## 2024-03-17 DIAGNOSIS — I10 Essential (primary) hypertension: Secondary | ICD-10-CM | POA: Diagnosis not present

## 2024-03-17 DIAGNOSIS — Z794 Long term (current) use of insulin: Secondary | ICD-10-CM | POA: Diagnosis not present

## 2024-03-17 DIAGNOSIS — D72829 Elevated white blood cell count, unspecified: Secondary | ICD-10-CM | POA: Diagnosis present

## 2024-03-17 DIAGNOSIS — K5730 Diverticulosis of large intestine without perforation or abscess without bleeding: Secondary | ICD-10-CM | POA: Diagnosis not present

## 2024-03-17 DIAGNOSIS — Z79899 Other long term (current) drug therapy: Secondary | ICD-10-CM | POA: Insufficient documentation

## 2024-03-17 DIAGNOSIS — Z1152 Encounter for screening for COVID-19: Secondary | ICD-10-CM | POA: Diagnosis not present

## 2024-03-17 DIAGNOSIS — I700 Atherosclerosis of aorta: Secondary | ICD-10-CM | POA: Diagnosis not present

## 2024-03-17 DIAGNOSIS — R110 Nausea: Secondary | ICD-10-CM | POA: Diagnosis present

## 2024-03-17 DIAGNOSIS — I2699 Other pulmonary embolism without acute cor pulmonale: Principal | ICD-10-CM | POA: Insufficient documentation

## 2024-03-17 DIAGNOSIS — A419 Sepsis, unspecified organism: Secondary | ICD-10-CM | POA: Diagnosis not present

## 2024-03-17 DIAGNOSIS — E119 Type 2 diabetes mellitus without complications: Secondary | ICD-10-CM | POA: Insufficient documentation

## 2024-03-17 DIAGNOSIS — I160 Hypertensive urgency: Secondary | ICD-10-CM | POA: Insufficient documentation

## 2024-03-17 DIAGNOSIS — Z0389 Encounter for observation for other suspected diseases and conditions ruled out: Secondary | ICD-10-CM | POA: Diagnosis not present

## 2024-03-17 DIAGNOSIS — I7121 Aneurysm of the ascending aorta, without rupture: Secondary | ICD-10-CM | POA: Diagnosis not present

## 2024-03-17 DIAGNOSIS — I159 Secondary hypertension, unspecified: Principal | ICD-10-CM | POA: Insufficient documentation

## 2024-03-17 DIAGNOSIS — Z6841 Body Mass Index (BMI) 40.0 and over, adult: Secondary | ICD-10-CM | POA: Insufficient documentation

## 2024-03-17 DIAGNOSIS — K8689 Other specified diseases of pancreas: Secondary | ICD-10-CM | POA: Diagnosis not present

## 2024-03-17 DIAGNOSIS — R109 Unspecified abdominal pain: Secondary | ICD-10-CM | POA: Diagnosis present

## 2024-03-17 DIAGNOSIS — M47816 Spondylosis without myelopathy or radiculopathy, lumbar region: Secondary | ICD-10-CM | POA: Diagnosis not present

## 2024-03-17 LAB — COMPREHENSIVE METABOLIC PANEL WITH GFR
ALT: 22 U/L (ref 0–44)
AST: 17 U/L (ref 15–41)
Albumin: 3.5 g/dL (ref 3.5–5.0)
Alkaline Phosphatase: 67 U/L (ref 38–126)
Anion gap: 11 (ref 5–15)
BUN: 20 mg/dL (ref 6–20)
CO2: 22 mmol/L (ref 22–32)
Calcium: 9.4 mg/dL (ref 8.9–10.3)
Chloride: 105 mmol/L (ref 98–111)
Creatinine, Ser: 0.97 mg/dL (ref 0.44–1.00)
GFR, Estimated: >60 mL/min (ref >60)
Glucose, Bld: 140 mg/dL — ABNORMAL HIGH (ref 70–99)
Potassium: 4 mmol/L (ref 3.5–5.1)
Sodium: 138 mmol/L (ref 135–145)
Total Bilirubin: 0.7 mg/dL (ref 0.0–1.2)
Total Protein: 8 g/dL (ref 6.5–8.1)

## 2024-03-17 LAB — CBC WITH DIFFERENTIAL/PLATELET
Abs Immature Granulocytes: 0.05 K/uL (ref 0.00–0.07)
Basophils Absolute: 0.1 x10E3/uL (ref 0.0–0.2)
Basophils Absolute: 0.1 K/uL (ref 0.0–0.1)
Basophils Relative: 0 %
Basos: 1 %
EOS (ABSOLUTE): 0.3 x10E3/uL (ref 0.0–0.4)
Eos: 2 %
Eosinophils Absolute: 0.3 K/uL (ref 0.0–0.5)
Eosinophils Relative: 2 %
HCT: 38.9 % (ref 36.0–46.0)
Hematocrit: 38.1 % (ref 34.0–46.6)
Hemoglobin: 11.8 g/dL (ref 11.1–15.9)
Hemoglobin: 12.2 g/dL (ref 12.0–15.0)
Immature Grans (Abs): 0 x10E3/uL (ref 0.0–0.1)
Immature Granulocytes: 0 %
Immature Granulocytes: 0 %
Lymphocytes Absolute: 4.8 x10E3/uL — ABNORMAL HIGH (ref 0.7–3.1)
Lymphocytes Relative: 29 %
Lymphs Abs: 4.5 K/uL — ABNORMAL HIGH (ref 0.7–4.0)
Lymphs: 27 %
MCH: 26.8 pg (ref 26.6–33.0)
MCH: 26.6 pg (ref 26.0–34.0)
MCHC: 31 g/dL — ABNORMAL LOW (ref 31.5–35.7)
MCHC: 31.4 g/dL (ref 30.0–36.0)
MCV: 86 fL (ref 79–97)
MCV: 84.7 fL (ref 80.0–100.0)
Monocytes Absolute: 0.7 x10E3/uL (ref 0.1–0.9)
Monocytes Absolute: 0.7 K/uL (ref 0.1–1.0)
Monocytes Relative: 4 %
Monocytes: 4 %
Neutro Abs: 9.9 K/uL — ABNORMAL HIGH (ref 1.7–7.7)
Neutrophils Absolute: 11.6 x10E3/uL — ABNORMAL HIGH (ref 1.4–7.0)
Neutrophils Relative %: 65 %
Neutrophils: 66 %
Platelets: 406 x10E3/uL (ref 150–450)
Platelets: 422 K/uL — ABNORMAL HIGH (ref 150–400)
RBC: 4.41 x10E6/uL (ref 3.77–5.28)
RBC: 4.59 MIL/uL (ref 3.87–5.11)
RDW: 14.7 % (ref 11.7–15.4)
RDW: 15.4 % (ref 11.5–15.5)
WBC: 17.7 x10E3/uL — ABNORMAL HIGH (ref 3.4–10.8)
WBC: 15.4 K/uL — ABNORMAL HIGH (ref 4.0–10.5)
nRBC: 0 % (ref 0.0–0.2)

## 2024-03-17 LAB — RESP PANEL BY RT-PCR (RSV, FLU A&B, COVID)  RVPGX2
Influenza A by PCR: NEGATIVE
Influenza B by PCR: NEGATIVE
Resp Syncytial Virus by PCR: NEGATIVE
SARS Coronavirus 2 by RT PCR: NEGATIVE

## 2024-03-17 LAB — URINALYSIS, W/ REFLEX TO CULTURE (INFECTION SUSPECTED)
Bacteria, UA: NONE SEEN
Bilirubin Urine: NEGATIVE
Glucose, UA: NEGATIVE mg/dL
Hgb urine dipstick: NEGATIVE
Ketones, ur: NEGATIVE mg/dL
Leukocytes,Ua: NEGATIVE
Nitrite: NEGATIVE
Protein, ur: 100 mg/dL — AB
Specific Gravity, Urine: 1.019 (ref 1.005–1.030)
pH: 6 (ref 5.0–8.0)

## 2024-03-17 LAB — I-STAT CG4 LACTIC ACID, ED: Lactic Acid, Venous: 1.5 mmol/L (ref 0.5–1.9)

## 2024-03-17 LAB — PROTIME-INR
INR: 1 (ref 0.8–1.2)
Prothrombin Time: 14.1 s (ref 11.4–15.2)

## 2024-03-17 MED ORDER — HYDRALAZINE HCL 25 MG PO TABS
100.0000 mg | ORAL_TABLET | Freq: Once | ORAL | Status: AC
  Administered 2024-03-17: 100 mg via ORAL
  Filled 2024-03-17: qty 4

## 2024-03-17 MED ORDER — LACTATED RINGERS IV BOLUS
1000.0000 mL | Freq: Once | INTRAVENOUS | Status: AC
  Administered 2024-03-17: 1000 mL via INTRAVENOUS

## 2024-03-17 MED ORDER — HYDROCHLOROTHIAZIDE 25 MG PO TABS
50.0000 mg | ORAL_TABLET | Freq: Once | ORAL | Status: AC
  Administered 2024-03-17: 50 mg via ORAL
  Filled 2024-03-17: qty 2

## 2024-03-17 MED ORDER — IOHEXOL 350 MG/ML SOLN
75.0000 mL | Freq: Once | INTRAVENOUS | Status: AC | PRN
  Administered 2024-03-17: 75 mL via INTRAVENOUS

## 2024-03-17 NOTE — ED Triage Notes (Addendum)
 Says she has been feeling odd went to PCP and requested bloodwork.   Was notified of an elevated WBC count.   Denies any complaint, fevers, or symptoms.  Was started on Doxycycline just in case.

## 2024-03-17 NOTE — ED Provider Notes (Addendum)
 [facility] AT [facility] Provider Note   CSN: [id number] Arrival date & time: 03/17/24  1843     Patient presents with: Elevated WBC    Elaine Blair is a 58 y.o. female.   This is a 58 year old female presenting emergency department for with concern for systemic infection/sepsis.  Went to PCP as she felt off and had basic lab work done close to 2 weeks ago which showed an elevated white blood cell count.  Was placed on of Augmentin .  Follow back up with primary doctor had persistent elevation and was placed on doxycycline is on day 2.  Repeat labs with continued leukocytosis and was sent to the emergency department for further evaluation.  Her only complaint is some nausea, but not having abdominal pain.  No fevers at home, no headache, no chest pain, shortness of breath, no URI symptoms.  No dysuria.  She did have radiofrequency ablation to the SI joint couple months ago, but notes she is not having increased back pain, weakness in her legs.         Prior to Admission medications   Medication Sig Start Date End Date Taking? Authorizing Provider  ACCU-CHEK GUIDE TEST test strip 1 each by Other route 5 (five) times daily.    [provider]  albuterol (VENTOLIN HFA) 108 (90 Base) MCG/ACT inhaler Inhale 2 puffs into the lungs as needed. 02/28/21   [provider]  allopurinol  200 MG TABS Take 200 mg by mouth daily. 01/23/24   Rothfuss, Jacob T, PA-C  ascorbic acid (VITAMIN C) 500 MG tablet Take 500 mg by mouth daily. 04/01/17   [provider]  BELBUCA  750 MCG FILM Take 750 mcg by mouth as needed. Under tongue every 12 hours    [provider]  Cholecalciferol 10 MCG (400 UNIT) CAPS Take 5,000 Units by mouth daily. 02/28/21   [provider]  Continuous Glucose Receiver (DEXCOM G7 RECEIVER) DEVI Use alongside sensor to check blood glucose. 01/23/24   Rothfuss, Jacob T, PA-C  Continuous Glucose Sensor (DEXCOM  G7 SENSOR) MISC Apply one sensor to upper arm every 10 days 01/23/24   Rothfuss, Jacob T, PA-C  doxycycline (VIBRAMYCIN) 100 MG capsule Take 1 capsule (100 mg total) by mouth 2 (two) times daily for 7 days. 03/16/24 03/23/24  Ival Domino, FNP  glucose blood (ACCU-CHEK GUIDE TEST) test strip 1 each by Other route as needed. 12/27/22   [provider]  HUMALOG KWIKPEN 100 UNIT/ML KwikPen Inject 15 Units into the skin as needed. 15-25 UNITS AS NEEDED, SLIDING SCALE 03/01/21   [provider]  hydrALAZINE (APRESOLINE) 100 MG tablet Take 100 mg by mouth 3 (three) times daily. 10/06/16   [provider]  hydrochlorothiazide (HYDRODIURIL) 50 MG tablet Take 50 mg by mouth daily. 12/19/16   [provider]  ibuprofen (ADVIL) 800 MG tablet Take 800 mg by mouth 3 (three) times daily. Patient taking differently: Take 800 mg by mouth as needed. 12/08/23   [provider]  LANTUS SOLOSTAR 100 UNIT/ML Solostar Pen Inject 25 Units into the skin as needed. SLIDING SCALE    [provider]  MAGNESIUM PO Take 500 mg by mouth daily.    [provider]  Misc Natural Products (BEET ROOT PO) Take 500 mg by mouth daily.    [provider]  Multiple Vitamin (MULTI-VITAMIN) tablet Take 1 tablet by mouth daily. 10/31/21   [provider]  mupirocin ointment (BACTROBAN) 2 %  Apply 1 Application topically as needed.    [provider]  NEURONTIN 300 MG capsule Take 300 mg by mouth 3 (three) times daily. 01/11/24   [provider]  Omega-3 Fatty Acids (FISH OIL PO) Take 200 mg by mouth daily.    [provider]  omeprazole (PRILOSEC) 20 MG capsule Take 20 mg by mouth daily. 02/28/21   [provider]  rosuvastatin  (CRESTOR ) 10 MG tablet Take 1 tablet (10 mg total) by mouth daily. 01/23/24   Rothfuss, Jacob T, PA-C  tirzepatide  (MOUNJARO ) 7.5 MG/0.5ML Pen Inject 7.5 mg into the skin once a week. 03/05/24   Rothfuss, Jacob T,  PA-C  tiZANidine (ZANAFLEX) 4 MG tablet Take 4 mg by mouth as needed. 01/17/24   [provider]  triamcinolone  cream (KENALOG ) 0.1 % Apply 1 Application topically as needed. to affected area(s) 01/02/23   [provider]    Allergies: Latex    Review of Systems  Updated Vital Signs BP (!) 199/95   Pulse (!) 102   Temp 98.6 F (37 C) (Oral)   Resp 20   SpO2 100%   Physical Exam Vitals and nursing note reviewed.  Constitutional:      General: She is not in acute distress.    Appearance: She is obese. She is not toxic-appearing.  HENT:     Head: Normocephalic and atraumatic.     Nose: Nose normal.     Mouth/Throat:     Mouth: Mucous membranes are moist.  Eyes:     Conjunctiva/sclera: Conjunctivae normal.  Cardiovascular:     Rate and Rhythm: Normal rate and regular rhythm.  Pulmonary:     Effort: Pulmonary effort is normal.     Breath sounds: Normal breath sounds.  Abdominal:     General: Abdomen is flat. There is no distension.     Palpations: Abdomen is soft.     Tenderness: There is no abdominal tenderness. There is no guarding or rebound.  Musculoskeletal:     Right lower leg: No edema.     Left lower leg: No edema.  Skin:    General: Skin is warm and dry.     Capillary Refill: Capillary refill takes less than 2 seconds.     Findings: No lesion or rash.  Neurological:     Mental Status: She is alert and oriented to person, place, and time.  Psychiatric:        Mood and Affect: Mood normal.        Behavior: Behavior normal.     (all labs ordered are listed, but only abnormal results are displayed) Labs Reviewed  COMPREHENSIVE METABOLIC PANEL WITH GFR - Abnormal; Notable for the following components:      Result Value   Glucose, Bld 140 (*)    All other components within normal limits  CBC WITH DIFFERENTIAL/PLATELET - Abnormal; Notable for the following components:   WBC 15.4 (*)    Platelets 422 (*)    Neutro Abs 9.9 (*)    Lymphs Abs 4.5  (*)    All other components within normal limits  URINALYSIS, W/ REFLEX TO CULTURE (INFECTION SUSPECTED) - Abnormal; Notable for the following components:   Protein, ur 100 (*)    All other components within normal limits  RESP PANEL BY RT-PCR (RSV, FLU A&B, COVID)  RVPGX2  CULTURE, BLOOD (ROUTINE X 2)  CULTURE, BLOOD (ROUTINE X 2)  PROTIME-INR  D-DIMER, QUANTITATIVE  I-STAT CG4 LACTIC ACID, ED  TROPONIN I (HIGH  SENSITIVITY)    EKG: EKG Interpretation Date/Time:  Tuesday March 17 2024 19:04:00 EDT Ventricular Rate:  116 PR Interval:  162 QRS Duration:  86 QT Interval:  328 QTC Calculation: 455 R Axis:   28  Text Interpretation: Sinus tachycardia Otherwise normal ECG No previous ECGs available Confirmed by Neysa Clap [phone]) on 03/17/2024 10:27:55 PM  Radiology: CT L-SPINE NO CHARGE Result Date: 03/17/2024 CLINICAL DATA:  Possible sepsis EXAM: CT Lumbar Spine without contrast TECHNIQUE: Technique: Multiplanar CT images of the lumbar spine were reconstructed from contemporary CT of the Abdomen and Pelvis. RADIATION DOSE REDUCTION: This exam was performed according to the departmental dose-optimization program which includes automated exposure control, adjustment of the mA and/or kV according to patient size and/or use of iterative reconstruction technique. CONTRAST:  None or No additional COMPARISON:  MRI 09/20/2022 FINDINGS: Segmentation: 5 lumbar type vertebrae. Alignment: Straightening of lumbar lordosis. Grade 1 anterolisthesis L3 on L4. Vertebrae: No acute fracture or focal pathologic process. Paraspinal and other soft tissues: Aortic atherosclerosis. Disc levels: At L1-L2, no significant canal stenosis. Mild facet degenerative change. No high-grade foraminal narrowing. At L2-L3, patent disc space. No canal stenosis. Left foraminal disc bulging. Moderate hypertrophic facet degenerative change. No high-grade foraminal narrowing. At L3-L4, severe hypertrophic facet degenerative  changes. Listhesis and disc disease result in severe canal stenosis. Moderate right foraminal narrowing. At L4-L5, disc space narrowing and vacuum disc. Advanced hypertrophic facet degenerative changes. Disc bulge and posterior facet disease result in at least moderate canal stenosis. Severe right and moderate left foraminal narrowing. At L5-S1, disc space narrowing and vacuum disc. Moderate facet degenerative changes. Moderate bilateral foraminal narrowing. IMPRESSION: 1. No CT evidence for acute osseous abnormality. 2. Multilevel degenerative changes, most advanced at L3-L4 and L4-L5. 3. Aortic atherosclerosis. Aortic Atherosclerosis (ICD10-I70.0). Electronically Signed   By: Luke Bun M.D.   On: 03/17/2024 22:10   CT ABDOMEN PELVIS W CONTRAST Result Date: 03/17/2024 CLINICAL DATA:  Sepsis EXAM: CT ABDOMEN AND PELVIS WITH CONTRAST TECHNIQUE: Multidetector CT imaging of the abdomen and pelvis was performed using the standard protocol following bolus administration of intravenous contrast. RADIATION DOSE REDUCTION: This exam was performed according to the departmental dose-optimization program which includes automated exposure control, adjustment of the mA and/or kV according to patient size and/or use of iterative reconstruction technique. CONTRAST:  75mL OMNIPAQUE  IOHEXOL  350 MG/ML SOLN COMPARISON:  10/28/2016 FINDINGS: Lower chest: No acute pleural or parenchymal lung disease. Hepatobiliary: No focal liver abnormality is seen. No gallstones, gallbladder wall thickening, or biliary dilatation. Pancreas: Fatty atrophy of the pancreas. No inflammatory changes or pancreatic duct dilation. Spleen: Normal in size without focal abnormality. Adrenals/Urinary Tract: Adrenal glands are unremarkable. Kidneys are normal, without renal calculi, focal lesion, or hydronephrosis. Bladder is unremarkable. Stomach/Bowel: No bowel obstruction or ileus. Normal appendix. Scattered colonic diverticulosis without  diverticulitis. No bowel wall thickening or inflammatory change. Small hiatal hernia. Vascular/Lymphatic: Aortic atherosclerosis. No enlarged abdominal or pelvic lymph nodes. Reproductive: Status post hysterectomy. No adnexal masses. Other: No free fluid or free intraperitoneal gas. No abdominal wall hernia. Musculoskeletal: No acute or destructive bony abnormalities. Reconstructed images demonstrate no additional findings. IMPRESSION: 1. No acute intra-abdominal or intrapelvic process. 2. Colonic diverticulosis without diverticulitis. 3. Small hiatal hernia. 4.  Aortic Atherosclerosis (ICD10-I70.0). Electronically Signed   By: Ozell Daring M.D.   On: 03/17/2024 22:02   DG Chest 2 View if patient is not in a treatment room. Result Date: 03/17/2024 CLINICAL DATA:  Possible sepsis EXAM: CHEST - 2  VIEW COMPARISON:  10/07/2023 FINDINGS: Cardiac shadow is stable. Lungs are well aerated bilaterally. No bony abnormality is noted. IMPRESSION: No active cardiopulmonary disease. Electronically Signed   By: Oneil Devonshire M.D.   On: 03/17/2024 20:28     Procedures   Medications Ordered in the ED  lactated ringers bolus 1,000 mL (1,000 mLs Intravenous New Bag/Given 03/17/24 2049)  iohexol  (OMNIPAQUE ) 350 MG/ML injection 75 mL (75 mLs Intravenous Contrast Given 03/17/24 2126)  hydrALAZINE (APRESOLINE) tablet 100 mg (100 mg Oral Given 03/17/24 2241)  hydrochlorothiazide (HYDRODIURIL) tablet 50 mg (50 mg Oral Given 03/17/24 2327)    Clinical Course as of 03/17/24 2353  Tue Mar 17, 2024  2208 CT ABDOMEN PELVIS W CONTRAST IMPRESSION: 1. No acute intra-abdominal or intrapelvic process. 2. Colonic diverticulosis without diverticulitis. 3. Small hiatal hernia. 4.  Aortic Atherosclerosis (ICD10-I70.0).   Electronically Signed   By: Ozell Daring M.D.   On: 03/17/2024 22:02   [TY]  2259 Patient's heart rate is still in the 110s.  Appears to be in sinus rhythm on the monitor.  Is quite hypertensive.  Heart  rate did not really improve with IV fluids.  Given her leukocytosis elevated heart rate will admit for monitoring as well as further blood pressure medications for her severe hypertension. [TY]    Clinical Course User Index [TY] Neysa Caron PARAS, DO                                 Medical Decision Making This is a 58 year old female complex past medical history to include hypertension, diabetes, gout, morbid obesity, history of abdominal abscess presenting emergency department with concern for systemic infection.  Per chart review has had leukocytosis, and with some generalized malaise/feeling off, but no real symptoms other than some nausea yesterday.  She has a mildly elevated heart rate here, is hypertensive as well.  No overt external bacterial source of infection identified on exam.  Given her nausea is her only symptom her elevated heart rate and persistent leukocytosis despite antibiotics we will get CT scan of her abdomen to evaluate for intra-abdominal infection as her chest x-ray normal.  UA without evidence of UTI.  Her flu/COVID/RSV is also pending, however does not fit duration of symptoms.  Amount and/or Complexity of Data Reviewed External Data Reviewed:     Details: Placed on Augmentin  and doxycycline by PCP and urgent care Labs: ordered. Decision-making details documented in ED Course.    Details: Does have leukocytosis, slightly down from few days ago.  No elevated lactate.  Normal renal function.  No transaminitis to suggest hepatobiliary disease/infection flu/COVID/RSV negative.  UA without infection Radiology: ordered and independent interpretation performed. Decision-making details documented in ED Course.    Details: Chest x-ray without overt pneumonia  Risk Prescription drug management. Decision regarding hospitalization. Diagnosis or treatment significantly limited by social determinants of health.       Final diagnoses:  Secondary hypertension  Tachycardia     ED Discharge Orders     None          Neysa Caron PARAS, DO 03/17/24 2338    Neysa Caron PARAS, DO 03/17/24 2353

## 2024-03-17 NOTE — ED Triage Notes (Signed)
 Patient sent by PCP because she has a elevated WBC. She states that there is a infection in her body but unsure of the source. Pt states she has had injections in her back recently. Denies fevers, cough, congestion. Urinary symptoms. States she is slightly shob.

## 2024-03-17 NOTE — ED Provider Triage Note (Signed)
 Emergency Medicine Provider Triage Evaluation Note  Elaine Blair , a 58 y.o. female  was evaluated in triage.  Patient reports she had an elevated white blood cell count for the past couple weeks, and it has continued to elevate, so her PCP sent her here.  She reports that she wants to be checked for sepsis since she had this previously.  She denies any complaints such as cough, fevers, chills, abdominal pain, nausea, vomiting.  No urinary symptoms.  She denies any sore throat.  No headaches or neck pain.  She was started on a course of Augmentin  and doxycycline for potential infection by her PCP and urgent care.  Review of Systems  Positive: As above Negative: As above  Physical Exam  BP (!) 223/128 (BP Location: Right Arm)   Pulse (!) 117   Temp 99.7 F (37.6 C) (Oral)   Resp (!) 22   SpO2 98%  Gen:   Awake, no distress   Resp:  Normal effort  MSK:   Moves extremities without difficulty    Medical Decision Making  Medically screening exam initiated at 7:17 PM.  Appropriate orders placed.  Wajiha Royale Lennartz was informed that the remainder of the evaluation will be completed by another provider, this initial triage assessment does not replace that evaluation, and the importance of remaining in the ED until their evaluation is complete.     Veta Palma, PA-C 03/17/24 [phone]

## 2024-03-17 NOTE — ED Notes (Signed)
 Patient transported to X-ray

## 2024-03-17 NOTE — Telephone Encounter (Signed)
 Pt was requesting an rx for hibiclens to prevent infections after injections. A friend recommended it to her. Pt said she does not feel like she needs to see infectious disease. See lab dated 03/16/2024

## 2024-03-17 NOTE — Telephone Encounter (Signed)
 Copied from CRM #[id number]. Topic: Clinical - Medication Question >> Mar 17, 2024  1:48 PM Amber H wrote: Reason for CRM: Patient wanted to know if she could get a prescription for hibiclens (bacterial wash) because she was paying for it out of pocket for it currently. Wanted to see if insurance could pay for it.     [phone]

## 2024-03-17 NOTE — ED Notes (Addendum)
Pt in room

## 2024-03-17 NOTE — Telephone Encounter (Signed)
 TC to pt. Advised of results and PA recs to proceed to ER. Questions answered. Pt states she will go to either [facility] or [facility] this evening.

## 2024-03-17 NOTE — ED Notes (Signed)
 Patient transported to CT

## 2024-03-18 ENCOUNTER — Other Ambulatory Visit (HOSPITAL_COMMUNITY): Payer: Self-pay

## 2024-03-18 ENCOUNTER — Telehealth (HOSPITAL_COMMUNITY): Payer: Self-pay | Admitting: Pharmacy Technician

## 2024-03-18 ENCOUNTER — Other Ambulatory Visit: Payer: Self-pay

## 2024-03-18 ENCOUNTER — Emergency Department (HOSPITAL_COMMUNITY)

## 2024-03-18 DIAGNOSIS — I2699 Other pulmonary embolism without acute cor pulmonale: Secondary | ICD-10-CM | POA: Diagnosis present

## 2024-03-18 DIAGNOSIS — I2693 Single subsegmental pulmonary embolism without acute cor pulmonale: Secondary | ICD-10-CM | POA: Diagnosis not present

## 2024-03-18 LAB — TROPONIN I (HIGH SENSITIVITY)
Troponin I (High Sensitivity): 23 ng/L — ABNORMAL HIGH (ref <18)
Troponin I (High Sensitivity): 23 ng/L — ABNORMAL HIGH (ref <18)

## 2024-03-18 LAB — CBG MONITORING, ED: Glucose-Capillary: 177 mg/dL — ABNORMAL HIGH (ref 70–99)

## 2024-03-18 LAB — T4, FREE: Free T4: 1.04 ng/dL (ref 0.61–1.12)

## 2024-03-18 LAB — GLUCOSE, CAPILLARY
Glucose-Capillary: 185 mg/dL — ABNORMAL HIGH (ref 70–99)
Glucose-Capillary: 226 mg/dL — ABNORMAL HIGH (ref 70–99)
Glucose-Capillary: 190 mg/dL — ABNORMAL HIGH (ref 70–99)

## 2024-03-18 LAB — TSH: TSH: 0.753 u[IU]/mL (ref 0.350–4.500)

## 2024-03-18 LAB — D-DIMER, QUANTITATIVE: D-Dimer, Quant: 1.43 ug{FEU}/mL — ABNORMAL HIGH (ref 0.00–0.50)

## 2024-03-18 MED ORDER — APIXABAN 5 MG PO TABS
5.0000 mg | ORAL_TABLET | Freq: Two times a day (BID) | ORAL | Status: DC
Start: 2024-03-25 — End: 2024-03-19

## 2024-03-18 MED ORDER — ATORVASTATIN CALCIUM 10 MG PO TABS
20.0000 mg | ORAL_TABLET | Freq: Every day | ORAL | Status: DC
  Administered 2024-03-18 – 2024-03-19 (×2): 20 mg via ORAL
  Filled 2024-03-18 (×2): qty 2

## 2024-03-18 MED ORDER — ONDANSETRON 4 MG PO TBDP: 4.0000 mg | ORAL_TABLET | Freq: Once | ORAL | Status: DC | PRN

## 2024-03-18 MED ORDER — ROSUVASTATIN CALCIUM 5 MG PO TABS: 10.0000 mg | ORAL_TABLET | Freq: Every day | ORAL | Status: DC

## 2024-03-18 MED ORDER — IOHEXOL 350 MG/ML SOLN
70.0000 mL | Freq: Once | INTRAVENOUS | Status: AC | PRN
Start: 2024-03-18 — End: 2024-03-18
  Administered 2024-03-18: 70 mL via INTRAVENOUS

## 2024-03-18 MED ORDER — BUPRENORPHINE HCL 750 MCG BU FILM
750.0000 ug | ORAL_FILM | Freq: Two times a day (BID) | BUCCAL | Status: DC
Start: 2024-03-18 — End: 2024-03-19

## 2024-03-18 MED ORDER — HYDRALAZINE HCL 100 MG PO TABS: 100.0000 mg | ORAL_TABLET | Freq: Three times a day (TID) | ORAL | Status: DC

## 2024-03-18 MED ORDER — LABETALOL HCL 5 MG/ML IV SOLN
10.0000 mg | Freq: Once | INTRAVENOUS | Status: AC
  Administered 2024-03-18: 10 mg via INTRAVENOUS
  Filled 2024-03-18: qty 4

## 2024-03-18 MED ORDER — ACETAMINOPHEN 325 MG PO TABS
650.0000 mg | ORAL_TABLET | Freq: Four times a day (QID) | ORAL | Status: DC | PRN
  Administered 2024-03-19: 650 mg via ORAL
  Filled 2024-03-18: qty 2

## 2024-03-18 MED ORDER — APIXABAN 5 MG PO TABS
10.0000 mg | ORAL_TABLET | Freq: Two times a day (BID) | ORAL | Status: DC
Start: 2024-03-18 — End: 2024-03-19
  Administered 2024-03-18 – 2024-03-19 (×3): 10 mg via ORAL
  Filled 2024-03-18 (×3): qty 2

## 2024-03-18 MED ORDER — LISINOPRIL 20 MG PO TABS
40.0000 mg | ORAL_TABLET | Freq: Every day | ORAL | Status: DC
  Administered 2024-03-18 – 2024-03-19 (×2): 40 mg via ORAL
  Filled 2024-03-18 (×2): qty 2

## 2024-03-18 MED ORDER — HYDROCHLOROTHIAZIDE 25 MG PO TABS: 50.0000 mg | ORAL_TABLET | Freq: Every day | ORAL | Status: DC

## 2024-03-18 MED ORDER — INSULIN GLARGINE-YFGN 100 UNIT/ML ~~LOC~~ SOLN
25.0000 [IU] | Freq: Every day | SUBCUTANEOUS | Status: DC
  Administered 2024-03-18: 25 [IU] via SUBCUTANEOUS
  Filled 2024-03-18 (×2): qty 0.25

## 2024-03-18 MED ORDER — GABAPENTIN 300 MG PO CAPS
300.0000 mg | ORAL_CAPSULE | Freq: Three times a day (TID) | ORAL | Status: DC
  Administered 2024-03-18 – 2024-03-19 (×4): 300 mg via ORAL
  Filled 2024-03-18 (×4): qty 1

## 2024-03-18 MED ORDER — PANTOPRAZOLE SODIUM 40 MG PO TBEC
40.0000 mg | DELAYED_RELEASE_TABLET | Freq: Every day | ORAL | Status: DC
Start: 2024-03-18 — End: 2024-03-19
  Administered 2024-03-18 – 2024-03-19 (×2): 40 mg via ORAL
  Filled 2024-03-18 (×2): qty 1

## 2024-03-18 MED ORDER — INFLUENZA VIRUS VACC SPLIT PF (FLUZONE) 0.5 ML IM SUSY
0.5000 mL | PREFILLED_SYRINGE | INTRAMUSCULAR | Status: AC
  Administered 2024-03-19: 0.5 mL via INTRAMUSCULAR
  Filled 2024-03-18: qty 0.5

## 2024-03-18 MED ORDER — INSULIN ASPART 100 UNIT/ML IJ SOLN
0.0000 [IU] | Freq: Three times a day (TID) | INTRAMUSCULAR | Status: DC
  Administered 2024-03-18: 5 [IU] via SUBCUTANEOUS
  Administered 2024-03-18: 3 [IU] via SUBCUTANEOUS
  Administered 2024-03-19: 5 [IU] via SUBCUTANEOUS

## 2024-03-18 NOTE — ED Notes (Signed)
 2W called and notified of patient transport to unit. 2W states RN is ready to take pt. NT taking pt up.

## 2024-03-18 NOTE — ED Provider Notes (Signed)
 Care assumed 2300. Patient referred to the emergency department due to concern for sepsis given leukocytosis. Care assumed pending medicine consult, D dimer.  D dimer was elevated in a CTA was obtained, CTA is negative for small PE, no evidence of hard strain. She has recently had SI joint injection, she does have considerable hypertension, mild tachycardia, will started on heparin drip pending further evaluation and response for treatment. Medicine consulted for admission for ongoing care.   Griselda Norris, MD 03/18/24 0330

## 2024-03-18 NOTE — ED Notes (Signed)
 Moore MD at bedside. Notified that pt ordered lisinopril has not been given, as it has not been verified by pharmacy yet.

## 2024-03-18 NOTE — ED Notes (Signed)
 Secure chat sent to Georgina, MD to notify of patient blood pressure. Pt denies pain, headache, dizziness at this time.

## 2024-03-18 NOTE — H&P (Addendum)
 History and Physical    Patient: Elaine Blair FMW:[id number] DOB: 11/09/1965 DOA: 03/17/2024 DOS: the patient was seen and examined on 03/18/2024 PCP: Rothfuss, Lang DASEN, PA-C  Patient coming from: Home  Chief Complaint:  Chief Complaint  Patient presents with   Elevated WBC    HPI: Elaine Blair is a 58 y.o. female with medical history significant of HTN and DM2 p/w hypertensive urgency iso acute RLL PE.  The patient presented with an elevated white blood count over the past 2 weeks. The pt reported to her PCP on 03/06/2024 after having a racing heart rate and was found to have elevated WBC 13 and treated for presumed CAP with Augmentin ; despite finishing that course of abx, she continued to feel unwell. As such, she contacted her PCP on 03/16/2024 who advised her to go to the urgent care, where she was prescribed doxycycline for CAP iso ongoing worsening leukocytosis (up from 13 to 17). Despite taking the medicine for two days the pt did not improve; in fact, she endorsed suddenly worse acute cp, increased heart rate, and elevated BP beginning yesterday that prompted her to come to the ED.  In the ED, pt tachycardic and hypertensive (SBP 200s which improved with giving IV hydralazine and her home dose of p.o. hydralazine). Labs w/ leukocytosis 15.4, and troponins flat at 23. CT angiogram shows a small PE. EDP started PO apixaban and requested medical observation for hypertensive urgency.   Review of Systems: As mentioned in the history of present illness. All other systems reviewed and are negative. Past Medical History:  Diagnosis Date   Diabetes mellitus without complication (HCC)    Gout    History of abdominal abscess 11/16/2016   Hypertension    Osteoarthritis    Past Surgical History:  Procedure Laterality Date   CESAREAN SECTION     x2   SHOULDER SURGERY     shoulder repair   TOTAL ABDOMINAL HYSTERECTOMY Bilateral    Social History:  reports that she has  never smoked. She has never been exposed to tobacco smoke. She has never used smokeless tobacco. She reports that she does not drink alcohol and does not use drugs.  Allergies  Allergen Reactions   Gabapentin Swelling    Patient is unable to take generic formulation - she currently takes brand name Neurontin 300mg  TID PRN with no issues.   Latex Hives, Swelling and Rash    Family History  Problem Relation Age of Onset   Hypertension Mother    Diabetes Mother    Tuberculosis Father     Prior to Admission medications   Medication Sig Start Date End Date Taking? Authorizing Provider  ACCU-CHEK GUIDE TEST test strip 1 each by Other route 5 (five) times daily.    [provider]  albuterol (VENTOLIN HFA) 108 (90 Base) MCG/ACT inhaler Inhale 2 puffs into the lungs as needed. 02/28/21   [provider]  allopurinol  200 MG TABS Take 200 mg by mouth daily. 01/23/24   Rothfuss, Jacob T, PA-C  ascorbic acid (VITAMIN C) 500 MG tablet Take 500 mg by mouth daily. 04/01/17   [provider]  BELBUCA  750 MCG FILM Take 750 mcg by mouth as needed. Under tongue every 12 hours    [provider]  Cholecalciferol 10 MCG (400 UNIT) CAPS Take 5,000 Units by mouth daily. 02/28/21   [provider]  Continuous Glucose Receiver (DEXCOM G7 RECEIVER) DEVI Use alongside sensor to check blood glucose. 01/23/24  Rothfuss, Jacob T, PA-C  Continuous Glucose Sensor (DEXCOM G7 SENSOR) MISC Apply one sensor to upper arm every 10 days 01/23/24   Rothfuss, Jacob T, PA-C  doxycycline (VIBRAMYCIN) 100 MG capsule Take 1 capsule (100 mg total) by mouth 2 (two) times daily for 7 days. 03/16/24 03/23/24  Ival Domino, FNP  glucose blood (ACCU-CHEK GUIDE TEST) test strip 1 each by Other route as needed. 12/27/22   [provider]  HUMALOG KWIKPEN 100 UNIT/ML KwikPen Inject 15 Units into the skin as needed. 15-25 UNITS AS NEEDED, SLIDING SCALE 03/01/21   [provider]   hydrALAZINE (APRESOLINE) 100 MG tablet Take 100 mg by mouth 3 (three) times daily. 10/06/16   [provider]  hydrochlorothiazide (HYDRODIURIL) 50 MG tablet Take 50 mg by mouth daily. 12/19/16   [provider]  ibuprofen (ADVIL) 800 MG tablet Take 800 mg by mouth 3 (three) times daily. Patient taking differently: Take 800 mg by mouth as needed. 12/08/23   [provider]  LANTUS SOLOSTAR 100 UNIT/ML Solostar Pen Inject 25 Units into the skin as needed. SLIDING SCALE    [provider]  MAGNESIUM PO Take 500 mg by mouth daily.    [provider]  Misc Natural Products (BEET ROOT PO) Take 500 mg by mouth daily.    [provider]  Multiple Vitamin (MULTI-VITAMIN) tablet Take 1 tablet by mouth daily. 10/31/21   [provider]  mupirocin ointment (BACTROBAN) 2 % Apply 1 Application topically as needed.    [provider]  NEURONTIN 300 MG capsule Take 300 mg by mouth 3 (three) times daily. 01/11/24   [provider]  Omega-3 Fatty Acids (FISH OIL PO) Take 200 mg by mouth daily.    [provider]  omeprazole (PRILOSEC) 20 MG capsule Take 20 mg by mouth daily. 02/28/21   [provider]  rosuvastatin  (CRESTOR ) 10 MG tablet Take 1 tablet (10 mg total) by mouth daily. 01/23/24   Rothfuss, Jacob T, PA-C  tirzepatide  (MOUNJARO ) 7.5 MG/0.5ML Pen Inject 7.5 mg into the skin once a week. 03/05/24   Rothfuss, Jacob T, PA-C  tiZANidine (ZANAFLEX) 4 MG tablet Take 4 mg by mouth as needed. 01/17/24   [provider]  triamcinolone  cream (KENALOG ) 0.1 % Apply 1 Application topically as needed. to affected area(s) 01/02/23   [provider]    Physical Exam: Vitals:   03/18/24 1154 03/18/24 1215 03/18/24 1330 03/18/24 1333  BP: (!) 183/115 (!) 217/143 (!) 152/114 (!) 187/111  Pulse: 89  88 86  Resp: 18   19  Temp:      TempSrc:      SpO2: 100%  100% 100%  Weight:      Height:       General:  Alert, oriented x3, resting comfortably in no acute distress Respiratory: Lungs clear to auscultation bilaterally with normal respiratory effort; no w/r/r Cardiovascular: Regular rate and rhythm w/o m/r/g   Data Reviewed:  Lab Results  Component Value Date   WBC 15.4 (H) 03/17/2024   HGB 12.2 03/17/2024   HCT 38.9 03/17/2024   MCV 84.7 03/17/2024   PLT 422 (H) 03/17/2024   Lab Results  Component Value Date   GLUCOSE 140 (H) 03/17/2024   CALCIUM  9.4 03/17/2024   NA 138 03/17/2024   K 4.0 03/17/2024   CO2 22 03/17/2024   CL 105 03/17/2024   BUN 20 03/17/2024   CREATININE 0.97 03/17/2024   Lab Results  Component Value  Date   ALT 22 03/17/2024   AST 17 03/17/2024   ALKPHOS 67 03/17/2024   BILITOT 0.7 03/17/2024   Lab Results  Component Value Date   INR 1.0 03/17/2024   INR 1.0 10/07/2023   Radiology: CT Angio Chest PE W/Cm &/Or Wo Cm Addendum Date: 03/18/2024 ** ADDENDUM #1 ** ADDENDUM: Critical value/emergent results were called by telephone at the time of interpretation on 10 / 22 / 25 at 1:10 am to Dr. Griselda, who verbally acknowledged these results. Electronically signed by: Norman Gatlin MD 03/18/2024 01:09 AM EDT RP Workstation: [id number]   Result Date: 03/18/2024 ** ORIGINAL REPORT ** EXAM: CTA of the Chest with contrast for PE 03/18/2024 12:41:22 AM TECHNIQUE: CTA of the chest was performed after the administration of 70 mL of intravenous contrast (iohexol  (OMNIPAQUE ) 350 MG/ML injection 70 mL IOHEXOL  350 MG/ML SOLN). Multiplanar reformatted images are provided for review. MIP images are provided for review. Automated exposure control, iterative reconstruction, and/or weight based adjustment of the mA/kV was utilized to reduce the radiation dose to as low as reasonably achievable. COMPARISON: X-ray 10 / 21 / 25 CLINICAL HISTORY: Pulmonary embolism (PE) suspected, low to intermediate prob, positive D-dimer. Elevated WBC, positive d-dimer ; No hx of PE; 70 ml omni 350.  FINDINGS: PULMONARY ARTERIES: Pulmonary arteries are adequately opacified for evaluation. Small subsegmental pulmonary emboli in the posterior right lower lobe pulmonary arteries. Main pulmonary artery is normal in caliber. MEDIASTINUM: The heart and pericardium demonstrate no acute abnormality. No evidence of right heart strain. PA ascending aorta is dilated measuring 42 mm. LYMPH NODES: No mediastinal, hilar or axillary lymphadenopathy. LUNGS AND PLEURA: The lungs are without acute process. No focal consolidation or pulmonary edema. No pleural effusion or pneumothorax. UPPER ABDOMEN: Small hiatal hernia. SOFT TISSUES AND BONES: No acute bone or soft tissue abnormality. IMPRESSION: 1. Small subsegmental pulmonary emboli in the posterior right lower lobe pulmonary arteries. No evidence of right heart strain. 2. Dilated ascending aorta measuring 42 mm.Recommend annual imaging followup by CTA or MRA. This recommendation follows 2010 ACCF/AHA/AATS/ACR/ASA/SCA/SCAI/SIR/STS/SVM Guidelines for the Diagnosis and Management of Patients with Thoracic Aortic Disease. Circulation. 2010; 121: Z733-z630. Aortic aneurysm NOS (ICD10-I71.9) Electronically signed by: Norman Gatlin MD 03/18/2024 12:50 AM EDT RP Workstation: [id number]   CT L-SPINE NO CHARGE Result Date: 03/17/2024 CLINICAL DATA:  Possible sepsis EXAM: CT Lumbar Spine without contrast TECHNIQUE: Technique: Multiplanar CT images of the lumbar spine were reconstructed from contemporary CT of the Abdomen and Pelvis. RADIATION DOSE REDUCTION: This exam was performed according to the departmental dose-optimization program which includes automated exposure control, adjustment of the mA and/or kV according to patient size and/or use of iterative reconstruction technique. CONTRAST:  None or No additional COMPARISON:  MRI 09/20/2022 FINDINGS: Segmentation: 5 lumbar type vertebrae. Alignment: Straightening of lumbar lordosis. Grade 1 anterolisthesis L3 on L4. Vertebrae: No  acute fracture or focal pathologic process. Paraspinal and other soft tissues: Aortic atherosclerosis. Disc levels: At L1-L2, no significant canal stenosis. Mild facet degenerative change. No high-grade foraminal narrowing. At L2-L3, patent disc space. No canal stenosis. Left foraminal disc bulging. Moderate hypertrophic facet degenerative change. No high-grade foraminal narrowing. At L3-L4, severe hypertrophic facet degenerative changes. Listhesis and disc disease result in severe canal stenosis. Moderate right foraminal narrowing. At L4-L5, disc space narrowing and vacuum disc. Advanced hypertrophic facet degenerative changes. Disc bulge and posterior facet disease result in at least moderate canal stenosis. Severe right and moderate left foraminal narrowing. At L5-S1, disc space narrowing and vacuum  disc. Moderate facet degenerative changes. Moderate bilateral foraminal narrowing. IMPRESSION: 1. No CT evidence for acute osseous abnormality. 2. Multilevel degenerative changes, most advanced at L3-L4 and L4-L5. 3. Aortic atherosclerosis. Aortic Atherosclerosis (ICD10-I70.0). Electronically Signed   By: Luke Bun M.D.   On: 03/17/2024 22:10   CT ABDOMEN PELVIS W CONTRAST Result Date: 03/17/2024 CLINICAL DATA:  Sepsis EXAM: CT ABDOMEN AND PELVIS WITH CONTRAST TECHNIQUE: Multidetector CT imaging of the abdomen and pelvis was performed using the standard protocol following bolus administration of intravenous contrast. RADIATION DOSE REDUCTION: This exam was performed according to the departmental dose-optimization program which includes automated exposure control, adjustment of the mA and/or kV according to patient size and/or use of iterative reconstruction technique. CONTRAST:  75mL OMNIPAQUE  IOHEXOL  350 MG/ML SOLN COMPARISON:  10/28/2016 FINDINGS: Lower chest: No acute pleural or parenchymal lung disease. Hepatobiliary: No focal liver abnormality is seen. No gallstones, gallbladder wall thickening, or  biliary dilatation. Pancreas: Fatty atrophy of the pancreas. No inflammatory changes or pancreatic duct dilation. Spleen: Normal in size without focal abnormality. Adrenals/Urinary Tract: Adrenal glands are unremarkable. Kidneys are normal, without renal calculi, focal lesion, or hydronephrosis. Bladder is unremarkable. Stomach/Bowel: No bowel obstruction or ileus. Normal appendix. Scattered colonic diverticulosis without diverticulitis. No bowel wall thickening or inflammatory change. Small hiatal hernia. Vascular/Lymphatic: Aortic atherosclerosis. No enlarged abdominal or pelvic lymph nodes. Reproductive: Status post hysterectomy. No adnexal masses. Other: No free fluid or free intraperitoneal gas. No abdominal wall hernia. Musculoskeletal: No acute or destructive bony abnormalities. Reconstructed images demonstrate no additional findings. IMPRESSION: 1. No acute intra-abdominal or intrapelvic process. 2. Colonic diverticulosis without diverticulitis. 3. Small hiatal hernia. 4.  Aortic Atherosclerosis (ICD10-I70.0). Electronically Signed   By: Ozell Daring M.D.   On: 03/17/2024 22:02   DG Chest 2 View if patient is not in a treatment room. Result Date: 03/17/2024 CLINICAL DATA:  Possible sepsis EXAM: CHEST - 2 VIEW COMPARISON:  10/07/2023 FINDINGS: Cardiac shadow is stable. Lungs are well aerated bilaterally. No bony abnormality is noted. IMPRESSION: No active cardiopulmonary disease. Electronically Signed   By: Oneil Devonshire M.D.   On: 03/17/2024 20:28    Assessment and Plan: 50F h/o HTN and DM2 p/w hypertensive urgency iso acute RLL PE.  Acute RLL PE No prior episodes of VTE; no right heart strain -Start apixaban 10mg  BID for 7 days followed by 5mg  BID fro at least 6 months with OP f/u with PCP to determine if PE has resolved and OAC can be discontinued  Hypertensive urgency -HOLD pta hydrochlorothiazide and hydralazine -Start lisinopril 40mg  daily given DM and for ease of  administration -Consider Coreg if needed in addition to above new ACEi  -DM2 -Semglee 25U nightly + SSI TID AC prn -PTA Crestor  10mg  daily   Advance Care Planning:   Code Status: Full Code   Consults: N/A  Family Communication: Daughter  Severity of Illness: The appropriate patient status for this patient is OBSERVATION. Observation status is judged to be reasonable and necessary in order to provide the required intensity of service to ensure the patient's safety. The patient's presenting symptoms, physical exam findings, and initial radiographic and laboratory data in the context of their medical condition is felt to place them at decreased risk for further clinical deterioration. Furthermore, it is anticipated that the patient will be medically stable for discharge from the hospital within 2 midnights of admission.    ------- I spent 61 minutes reviewing previous notes, at the bedside counseling/discussing the treatment plan, and performing  clinical documentation.  Author: Marsha Ada, MD 03/18/2024 2:34 PM  For on call review [email or web address].

## 2024-03-18 NOTE — Telephone Encounter (Signed)
 Patient Product/process development scientist completed.    The patient is insured through [facility]. Patient has Medicare and is not eligible for a copay card, but may be able to apply for patient assistance or Medicare RX Payment Plan (Patient Must reach out to their plan, if eligible for payment plan), if available.    Ran test claim for Eliquis 5 mg and the current 30 day co-pay is $0.00.   This test claim was processed through [facility]- copay amounts may vary at other pharmacies due to pharmacy/plan contracts, or as the patient moves through the different stages of their insurance plan.     Reyes Sharps, CPHT Pharmacy Technician Patient Advocate Specialist Lead [facility] Patient Advocate Team Direct Number: [phone]  Fax: [phone]

## 2024-03-19 ENCOUNTER — Other Ambulatory Visit (HOSPITAL_COMMUNITY): Payer: Self-pay

## 2024-03-19 ENCOUNTER — Observation Stay (HOSPITAL_COMMUNITY)

## 2024-03-19 DIAGNOSIS — I10 Essential (primary) hypertension: Secondary | ICD-10-CM

## 2024-03-19 DIAGNOSIS — I7121 Aneurysm of the ascending aorta, without rupture: Secondary | ICD-10-CM

## 2024-03-19 DIAGNOSIS — I2699 Other pulmonary embolism without acute cor pulmonale: Secondary | ICD-10-CM

## 2024-03-19 DIAGNOSIS — I160 Hypertensive urgency: Secondary | ICD-10-CM | POA: Diagnosis not present

## 2024-03-19 LAB — BASIC METABOLIC PANEL WITH GFR
Anion gap: 13 (ref 5–15)
BUN: 20 mg/dL (ref 6–20)
CO2: 26 mmol/L (ref 22–32)
Calcium: 9.3 mg/dL (ref 8.9–10.3)
Chloride: 97 mmol/L — ABNORMAL LOW (ref 98–111)
Creatinine, Ser: 1.44 mg/dL — ABNORMAL HIGH (ref 0.44–1.00)
GFR, Estimated: 42 mL/min — ABNORMAL LOW (ref >60)
Glucose, Bld: 204 mg/dL — ABNORMAL HIGH (ref 70–99)
Potassium: 4 mmol/L (ref 3.5–5.1)
Sodium: 136 mmol/L (ref 135–145)

## 2024-03-19 LAB — CBC WITH DIFFERENTIAL/PLATELET
Abs Immature Granulocytes: 0.06 K/uL (ref 0.00–0.07)
Basophils Absolute: 0.1 K/uL (ref 0.0–0.1)
Basophils Relative: 0 %
Eosinophils Absolute: 0.2 K/uL (ref 0.0–0.5)
Eosinophils Relative: 1 %
HCT: 37.7 % (ref 36.0–46.0)
Hemoglobin: 12.1 g/dL (ref 12.0–15.0)
Immature Granulocytes: 0 %
Lymphocytes Relative: 28 %
Lymphs Abs: 4 K/uL (ref 0.7–4.0)
MCH: 26.5 pg (ref 26.0–34.0)
MCHC: 32.1 g/dL (ref 30.0–36.0)
MCV: 82.5 fL (ref 80.0–100.0)
Monocytes Absolute: 0.8 K/uL (ref 0.1–1.0)
Monocytes Relative: 5 %
Neutro Abs: 9.2 K/uL — ABNORMAL HIGH (ref 1.7–7.7)
Neutrophils Relative %: 66 %
Platelets: 438 K/uL — ABNORMAL HIGH (ref 150–400)
RBC: 4.57 MIL/uL (ref 3.87–5.11)
RDW: 15.1 % (ref 11.5–15.5)
WBC: 14.2 K/uL — ABNORMAL HIGH (ref 4.0–10.5)
nRBC: 0 % (ref 0.0–0.2)

## 2024-03-19 LAB — GLUCOSE, CAPILLARY
Glucose-Capillary: 214 mg/dL — ABNORMAL HIGH (ref 70–99)
Glucose-Capillary: 224 mg/dL — ABNORMAL HIGH (ref 70–99)

## 2024-03-19 LAB — ECHOCARDIOGRAM COMPLETE
Area-P 1/2: 3.91 cm2
Height: 66 in
S' Lateral: 3.7 cm
Weight: 4080 [oz_av]

## 2024-03-19 LAB — HIV ANTIBODY (ROUTINE TESTING W REFLEX): HIV Screen 4th Generation wRfx: NONREACTIVE

## 2024-03-19 MED ORDER — LISINOPRIL 40 MG PO TABS
40.0000 mg | ORAL_TABLET | Freq: Every day | ORAL | 2 refills | Status: AC
  Filled 2024-03-19 – 2024-06-27 (×3): qty 90, 90d supply, fill #0

## 2024-03-19 MED ORDER — APIXABAN (ELIQUIS) VTE STARTER PACK (10MG AND 5MG)
ORAL_TABLET | ORAL | 0 refills | Status: DC
  Filled 2024-03-19: qty 74, 30d supply, fill #0

## 2024-03-19 MED ORDER — APIXABAN 5 MG PO TABS
5.0000 mg | ORAL_TABLET | Freq: Two times a day (BID) | ORAL | 5 refills | Status: DC
  Filled 2024-03-19: qty 60, 30d supply, fill #0
  Filled 2024-05-19: qty 60, 30d supply, fill #1

## 2024-03-19 NOTE — Hospital Course (Signed)
 Elaine Blair is a 58 yo female with PMH DMII, gout, HTN, OA, obesity who presented with tachycardia and elevated BP.  Found to have hypertensive urgency. Ongoing leukocytosis. Had been on recent outpatient abx for presumed CAP but continued to feel unwell.  D-dimer elevated on admission and underwent PE protocol. CTA chest showed small subsegmental PE in posterior RLL.

## 2024-03-19 NOTE — Discharge Summary (Signed)
 Physician Discharge Summary   Elaine Blair FMW:[id number] DOB: 1965-10-06 DOA: 03/17/2024  PCP: Iven Lang DASEN, PA-C  Admit date: 03/17/2024 Discharge date: 03/19/2024  Admitted From: Home Disposition:  Home Discharging physician: Alm Apo, MD Barriers to discharge: none  Recommendations at discharge: Follow up BP and modify regimen further if necessary Clarify FH further of VTE history; consider hypercoag workup  Continue encouraging weight loss and lifestyle mods   Discharge Condition: stable CODE STATUS: Full  Diet recommendation:  Diet Orders (From admission, onward)     Start     Ordered   03/19/24 0000  Diet Carb Modified        03/19/24 1211   03/18/24 1434  Diet Carb Modified Fluid consistency: Thin; Room service appropriate? Yes  Diet effective now       Question Answer Comment  Diet-HS Snack? Nothing   Calorie Level Medium 1600-2000   Fluid consistency: Thin   Room service appropriate? Yes      03/18/24 1434            Hospital Course: [name] is a 58 yo female with PMH DMII, gout, HTN, OA, obesity who presented with tachycardia and elevated BP.  Found to have hypertensive urgency. Ongoing leukocytosis. Had been on recent outpatient abx for presumed CAP but continued to feel unwell.  D-dimer elevated on admission and underwent PE protocol. CTA chest showed small subsegmental PE in posterior RLL.   Assessment and Plan: * Pulmonary embolism (HCC) - Small subsegmental pulmonary emboli in the posterior right lower lobe  - started on Eliquis on admission x 6 months - appears unprovoked; no obvious risk factors noted aside from obesity; need to clarify family history of VTE further still (poor historian in hospital) - if FH positive for VTE, consider outpatient hypercoag workup after completion of Eliquis   HTN (hypertension) - uncontrolled BP on admission - trial of transition to lisinopril - d/c hydralazine and hctz  Ascending aortic  aneurysm - per CTA chest:  Dilated ascending aorta measuring 42 mm. Recommend annual imaging followup by CTA or MRA.  Hypertensive urgency-resolved as of 03/19/2024 - transitioned to lisinopril     The patient's acute and chronic medical conditions were treated accordingly. On day of discharge, patient was felt deemed stable for discharge. Patient/family member advised to call PCP or come back to ER if needed.   Principal Diagnosis: Pulmonary embolism [facility])  Discharge Diagnoses: Active Hospital Problems   Diagnosis Date Noted   Pulmonary embolism (HCC) 03/18/2024    Priority: 1.   HTN (hypertension) 11/16/2016    Priority: 2.   Ascending aortic aneurysm 03/19/2024    Priority: 3.   Diabetes mellitus type 2, insulin dependent (HCC) 11/16/2016    Resolved Hospital Problems   Diagnosis Date Noted Date Resolved   Hypertensive urgency 03/19/2024 03/19/2024     Discharge Instructions     Diet Carb Modified   Complete by: As directed    Increase activity slowly   Complete by: As directed       Allergies as of 03/19/2024       Reactions   Gabapentin Swelling   Patient is unable to take generic formulation - she currently takes brand name Neurontin 300mg  TID PRN with no issues.   Latex Hives, Swelling, Rash        Medication List     STOP taking these medications    amoxicillin -clavulanate 875-125 MG tablet Commonly known as: AUGMENTIN    doxycycline 100 MG capsule Commonly  known as: VIBRAMYCIN   hydrALAZINE 100 MG tablet Commonly known as: APRESOLINE   hydrochlorothiazide 50 MG tablet Commonly known as: HYDRODIURIL   POTASSIUM PO       TAKE these medications    acetaminophen  650 MG CR tablet Commonly known as: TYLENOL  Take 1,300 mg by mouth 2 (two) times daily as needed for pain or fever.   allopurinol  100 MG tablet Commonly known as: ZYLOPRIM  Take 100 mg by mouth daily.   Apixaban Starter Pack (10mg  and 5mg ) Commonly known as: ELIQUIS STARTER  PACK Take as directed on package: start with two-5mg  tablets twice daily for 7 days. On day 8, switch to one-5mg  tablet twice daily.   apixaban 5 MG Tabs tablet Commonly known as: ELIQUIS Take 1 tablet (5 mg total) by mouth 2 (two) times daily. Start on 04/18/2024 after completing starter pack Start taking on: April 18, 2024   atorvastatin 20 MG tablet Commonly known as: LIPITOR Take 20 mg by mouth daily.   BEET ROOT PO Take 1 tablet by mouth daily.   Belbuca  750 MCG Film Generic drug: Buprenorphine  HCl Take 750 mcg by mouth in the morning and at bedtime. Under tongue every 12 hours   Dexcom G7 Receiver Devi Use alongside sensor to check blood glucose.   Dexcom G7 Sensor Misc Apply one sensor to upper arm every 10 days   FISH OIL PO Take 1 capsule by mouth at bedtime.   HumaLOG KwikPen 100 UNIT/ML KwikPen Generic drug: insulin lispro Inject 0-25 Units into the skin 4 (four) times daily as needed (hyperglycemia; per sliding scale.).   Lantus SoloStar 100 UNIT/ML Solostar Pen Generic drug: insulin glargine Inject 20-30 Units into the skin at bedtime as needed (hyperglycemia; per sliding scale).   lisinopril 40 MG tablet Commonly known as: ZESTRIL Take 1 tablet (40 mg total) by mouth daily. Start taking on: March 20, 2024   MAGNESIUM PO Take 1 tablet by mouth daily.   mupirocin ointment 2 % Commonly known as: BACTROBAN Apply 1 Application topically 3 (three) times daily as needed (sores).   Neurontin 300 MG capsule Generic drug: gabapentin Take 300 mg by mouth 3 (three) times daily as needed (neuropathy). BRAND NAME ONLY   omeprazole 20 MG capsule Commonly known as: PRILOSEC Take 20 mg by mouth daily.   Ozempic (2 MG/DOSE) 8 MG/3ML Sopn Generic drug: Semaglutide (2 MG/DOSE) Inject 2 mg into the skin every Tuesday.   tirzepatide  7.5 MG/0.5ML Pen Commonly known as: MOUNJARO  Inject 7.5 mg into the skin once a week. What changed: when to take this    tiZANidine 4 MG tablet Commonly known as: ZANAFLEX Take 4 mg by mouth every 8 (eight) hours as needed for muscle spasms.   triamcinolone  cream 0.1 % Commonly known as: KENALOG  Apply 1 Application topically 2 (two) times daily as needed (skin irritation). to affected area(s)   VITAMIN B-12 PO Take 1 tablet by mouth daily.   VITAMIN C PO Take 1 tablet by mouth daily.   VITAMIN D-3 PO Take 1 capsule by mouth daily.   Womens Multivitamin Tabs Take 1 tablet by mouth daily.        Allergies  Allergen Reactions   Gabapentin Swelling    Patient is unable to take generic formulation - she currently takes brand name Neurontin 300mg  TID PRN with no issues.   Latex Hives, Swelling and Rash    Consultations:   Procedures:   Discharge Exam: BP 130/88 (BP Location: Left Wrist)   Pulse  93   Temp 98.6 F (37 C) (Oral)   Resp 17   Ht 5' 6 (1.676 m)   Wt 115.7 kg   SpO2 98%   BMI 41.16 kg/m  Physical Exam Constitutional:      Appearance: Normal appearance. She is obese.  HENT:     Head: Normocephalic and atraumatic.     Mouth/Throat:     Mouth: Mucous membranes are moist.  Eyes:     Extraocular Movements: Extraocular movements intact.  Cardiovascular:     Rate and Rhythm: Normal rate and regular rhythm.  Pulmonary:     Effort: Pulmonary effort is normal. No respiratory distress.     Breath sounds: Normal breath sounds. No wheezing.  Abdominal:     General: Bowel sounds are normal. There is no distension.     Palpations: Abdomen is soft.     Tenderness: There is no abdominal tenderness.  Musculoskeletal:        General: Normal range of motion.     Cervical back: Normal range of motion and neck supple.  Skin:    General: Skin is warm and dry.  Neurological:     General: No focal deficit present.     Mental Status: She is alert.  Psychiatric:        Mood and Affect: Mood normal.      The results of significant diagnostics from this hospitalization  (including imaging, microbiology, ancillary and laboratory) are listed below for reference.   Microbiology: Recent Results (from the past 240 hours)  Culture, blood (Routine x 2)     Status: None (Preliminary result)   Collection Time: 03/17/24  7:25 PM   Specimen: BLOOD RIGHT ARM  Result Value Ref Range Status   Specimen Description BLOOD RIGHT ARM  Final   Special Requests   Final    BOTTLES DRAWN AEROBIC AND ANAEROBIC Blood Culture adequate volume   Culture   Final    NO GROWTH 2 DAYS Performed at [facility], [address], [place], [place] [address]    Report Status PENDING  Incomplete  Culture, blood (Routine x 2)     Status: None (Preliminary result)   Collection Time: 03/17/24  7:35 PM   Specimen: BLOOD LEFT ARM  Result Value Ref Range Status   Specimen Description BLOOD LEFT ARM  Final   Special Requests   Final    BOTTLES DRAWN AEROBIC AND ANAEROBIC Blood Culture adequate volume   Culture   Final    NO GROWTH 2 DAYS Performed at [facility], [address] [place], [place] [address]    Report Status PENDING  Incomplete  Resp panel by RT-PCR (RSV, Flu A&B, Covid) Anterior Nasal Swab     Status: None   Collection Time: 03/17/24  8:50 PM   Specimen: Anterior Nasal Swab  Result Value Ref Range Status   SARS Coronavirus 2 by RT PCR NEGATIVE NEGATIVE Final   Influenza A by PCR NEGATIVE NEGATIVE Final   Influenza B by PCR NEGATIVE NEGATIVE Final    Comment: (NOTE) The Xpert Xpress SARS-CoV-2/FLU/RSV plus assay is intended as an aid in the diagnosis of influenza from Nasopharyngeal swab specimens and should not be used as a sole basis for treatment. Nasal washings and aspirates are unacceptable for Xpert Xpress SARS-CoV-2/FLU/RSV testing.  Fact Sheet for Patients: BloggerCourse.com  Fact Sheet for Healthcare Providers: SeriousBroker.it  This test is not yet approved or cleared by the [place]  FDA  and has been  authorized for detection and/or diagnosis of SARS-CoV-2 by FDA under an Emergency Use Authorization (EUA). This EUA will remain in effect (meaning this test can be used) for the duration of the COVID-19 declaration under Section 564(b)(1) of the Act, 21 U.S.C. section 360bbb-3(b)(1), unless the authorization is terminated or revoked.     Resp Syncytial Virus by PCR NEGATIVE NEGATIVE Final    Comment: (NOTE) Fact Sheet for Patients: BloggerCourse.com  Fact Sheet for Healthcare Providers: SeriousBroker.it  This test is not yet approved or cleared by the [place]  FDA and has been authorized for detection and/or diagnosis of SARS-CoV-2 by FDA under an Emergency Use Authorization (EUA). This EUA will remain in effect (meaning this test can be used) for the duration of the COVID-19 declaration under Section 564(b)(1) of the Act, 21 U.S.C. section 360bbb-3(b)(1), unless the authorization is terminated or revoked.  Performed at [facility], [address], [place], [place] [address]      Labs: BNP (last 3 results) No results for input(s): BNP in the last 8760 hours. Basic Metabolic Panel: Recent Labs  Lab 03/17/24 1925 03/19/24 0246  NA 138 136  K 4.0 4.0  CL 105 97*  CO2 22 26  GLUCOSE 140* 204*  BUN 20 20  CREATININE 0.97 1.44*  CALCIUM  9.4 9.3   Liver Function Tests: Recent Labs  Lab 03/17/24 1925  AST 17  ALT 22  ALKPHOS 67  BILITOT 0.7  PROT 8.0  ALBUMIN 3.5   No results for input(s): LIPASE, AMYLASE in the last 168 hours. No results for input(s): AMMONIA in the last 168 hours. CBC: Recent Labs  Lab 03/13/24 1152 03/16/24 1648 03/17/24 1925 03/19/24 1317  WBC 13.4* 17.7* 15.4* 14.2*  NEUTROABS 8.5* 11.6* 9.9* 9.2*  HGB 11.8 11.8 12.2 12.1  HCT 37.6 38.1 38.9 37.7  MCV 84 86 84.7 82.5  PLT 406 406 422* 438*   Cardiac Enzymes: No results for input(s): CKTOTAL,  CKMB, CKMBINDEX, TROPONINI in the last 168 hours. BNP: Invalid input(s): POCBNP CBG: Recent Labs  Lab 03/18/24 1537 03/18/24 1621 03/18/24 2109 03/19/24 0845 03/19/24 1145  GLUCAP 185* 226* 190* 214* 224*   D-Dimer Recent Labs    03/17/24 2330  DDIMER 1.43*   Hgb A1c No results for input(s): HGBA1C in the last 72 hours. Lipid Profile No results for input(s): CHOL, HDL, LDLCALC, TRIG, CHOLHDL, LDLDIRECT in the last 72 hours. Thyroid  function studies Recent Labs    03/18/24 0700  TSH 0.753   Anemia work up No results for input(s): VITAMINB12, FOLATE, FERRITIN, TIBC, IRON, RETICCTPCT in the last 72 hours. Urinalysis    Component Value Date/Time   COLORURINE YELLOW 03/17/2024 1915   APPEARANCEUR CLEAR 03/17/2024 1915   LABSPEC 1.019 03/17/2024 1915   PHURINE 6.0 03/17/2024 1915   GLUCOSEU NEGATIVE 03/17/2024 1915   HGBUR NEGATIVE 03/17/2024 1915   BILIRUBINUR NEGATIVE 03/17/2024 1915   KETONESUR NEGATIVE 03/17/2024 1915   PROTEINUR 100 (A) 03/17/2024 1915   NITRITE NEGATIVE 03/17/2024 1915   LEUKOCYTESUR NEGATIVE 03/17/2024 1915   Sepsis Labs Recent Labs  Lab 03/13/24 1152 03/16/24 1648 03/17/24 1925 03/19/24 1317  WBC 13.4* 17.7* 15.4* 14.2*   Microbiology Recent Results (from the past 240 hours)  Culture, blood (Routine x 2)     Status: None (Preliminary result)   Collection Time: 03/17/24  7:25 PM   Specimen: BLOOD RIGHT ARM  Result Value Ref Range Status   Specimen Description BLOOD RIGHT ARM  Final   Special Requests  Final    BOTTLES DRAWN AEROBIC AND ANAEROBIC Blood Culture adequate volume   Culture   Final    NO GROWTH 2 DAYS Performed at [facility], [address] [place], [place] [address]    Report Status PENDING  Incomplete  Culture, blood (Routine x 2)     Status: None (Preliminary result)   Collection Time: 03/17/24  7:35 PM   Specimen: BLOOD LEFT ARM  Result Value Ref Range Status    Specimen Description BLOOD LEFT ARM  Final   Special Requests   Final    BOTTLES DRAWN AEROBIC AND ANAEROBIC Blood Culture adequate volume   Culture   Final    NO GROWTH 2 DAYS Performed at [facility], [address], [place], [place] [address]    Report Status PENDING  Incomplete  Resp panel by RT-PCR (RSV, Flu A&B, Covid) Anterior Nasal Swab     Status: None   Collection Time: 03/17/24  8:50 PM   Specimen: Anterior Nasal Swab  Result Value Ref Range Status   SARS Coronavirus 2 by RT PCR NEGATIVE NEGATIVE Final   Influenza A by PCR NEGATIVE NEGATIVE Final   Influenza B by PCR NEGATIVE NEGATIVE Final    Comment: (NOTE) The Xpert Xpress SARS-CoV-2/FLU/RSV plus assay is intended as an aid in the diagnosis of influenza from Nasopharyngeal swab specimens and should not be used as a sole basis for treatment. Nasal washings and aspirates are unacceptable for Xpert Xpress SARS-CoV-2/FLU/RSV testing.  Fact Sheet for Patients: BloggerCourse.com  Fact Sheet for Healthcare Providers: SeriousBroker.it  This test is not yet approved or cleared by the [place]  FDA and has been authorized for detection and/or diagnosis of SARS-CoV-2 by FDA under an Emergency Use Authorization (EUA). This EUA will remain in effect (meaning this test can be used) for the duration of the COVID-19 declaration under Section 564(b)(1) of the Act, 21 U.S.C. section 360bbb-3(b)(1), unless the authorization is terminated or revoked.     Resp Syncytial Virus by PCR NEGATIVE NEGATIVE Final    Comment: (NOTE) Fact Sheet for Patients: BloggerCourse.com  Fact Sheet for Healthcare Providers: SeriousBroker.it  This test is not yet approved or cleared by the [place]  FDA and has been authorized for detection and/or diagnosis of SARS-CoV-2 by FDA under an Emergency Use Authorization (EUA). This EUA will  remain in effect (meaning this test can be used) for the duration of the COVID-19 declaration under Section 564(b)(1) of the Act, 21 U.S.C. section 360bbb-3(b)(1), unless the authorization is terminated or revoked.  Performed at [facility], [address], [place], [place] [address]     Procedures/Studies: CT Angio Chest PE W/Cm &/Or Wo Cm Addendum Date: 03/18/2024 ** ADDENDUM #1 ** ADDENDUM: Critical value/emergent results were called by telephone at the time of interpretation on 10 / 22 / 25 at 1:10 am to Dr. Griselda, who verbally acknowledged these results. Electronically signed by: Norman Gatlin MD 03/18/2024 01:09 AM EDT RP Workstation: [id number]   Result Date: 03/18/2024 ** ORIGINAL REPORT ** EXAM: CTA of the Chest with contrast for PE 03/18/2024 12:41:22 AM TECHNIQUE: CTA of the chest was performed after the administration of 70 mL of intravenous contrast (iohexol  (OMNIPAQUE ) 350 MG/ML injection 70 mL IOHEXOL  350 MG/ML SOLN). Multiplanar reformatted images are provided for review. MIP images are provided for review. Automated exposure control, iterative reconstruction, and/or weight based adjustment of the mA/kV was utilized to reduce the radiation dose to as low as reasonably achievable.  COMPARISON: X-ray 10 / 21 / 25 CLINICAL HISTORY: Pulmonary embolism (PE) suspected, low to intermediate prob, positive D-dimer. Elevated WBC, positive d-dimer ; No hx of PE; 70 ml omni 350. FINDINGS: PULMONARY ARTERIES: Pulmonary arteries are adequately opacified for evaluation. Small subsegmental pulmonary emboli in the posterior right lower lobe pulmonary arteries. Main pulmonary artery is normal in caliber. MEDIASTINUM: The heart and pericardium demonstrate no acute abnormality. No evidence of right heart strain. PA ascending aorta is dilated measuring 42 mm. LYMPH NODES: No mediastinal, hilar or axillary lymphadenopathy. LUNGS AND PLEURA: The lungs are without acute process. No focal consolidation  or pulmonary edema. No pleural effusion or pneumothorax. UPPER ABDOMEN: Small hiatal hernia. SOFT TISSUES AND BONES: No acute bone or soft tissue abnormality. IMPRESSION: 1. Small subsegmental pulmonary emboli in the posterior right lower lobe pulmonary arteries. No evidence of right heart strain. 2. Dilated ascending aorta measuring 42 mm.Recommend annual imaging followup by CTA or MRA. This recommendation follows 2010 ACCF/AHA/AATS/ACR/ASA/SCA/SCAI/SIR/STS/SVM Guidelines for the Diagnosis and Management of Patients with Thoracic Aortic Disease. Circulation. 2010; 121: Z733-z630. Aortic aneurysm NOS (ICD10-I71.9) Electronically signed by: Norman Gatlin MD 03/18/2024 12:50 AM EDT RP Workstation: [id number]   CT L-SPINE NO CHARGE Result Date: 03/17/2024 CLINICAL DATA:  Possible sepsis EXAM: CT Lumbar Spine without contrast TECHNIQUE: Technique: Multiplanar CT images of the lumbar spine were reconstructed from contemporary CT of the Abdomen and Pelvis. RADIATION DOSE REDUCTION: This exam was performed according to the departmental dose-optimization program which includes automated exposure control, adjustment of the mA and/or kV according to patient size and/or use of iterative reconstruction technique. CONTRAST:  None or No additional COMPARISON:  MRI 09/20/2022 FINDINGS: Segmentation: 5 lumbar type vertebrae. Alignment: Straightening of lumbar lordosis. Grade 1 anterolisthesis L3 on L4. Vertebrae: No acute fracture or focal pathologic process. Paraspinal and other soft tissues: Aortic atherosclerosis. Disc levels: At L1-L2, no significant canal stenosis. Mild facet degenerative change. No high-grade foraminal narrowing. At L2-L3, patent disc space. No canal stenosis. Left foraminal disc bulging. Moderate hypertrophic facet degenerative change. No high-grade foraminal narrowing. At L3-L4, severe hypertrophic facet degenerative changes. Listhesis and disc disease result in severe canal stenosis. Moderate right  foraminal narrowing. At L4-L5, disc space narrowing and vacuum disc. Advanced hypertrophic facet degenerative changes. Disc bulge and posterior facet disease result in at least moderate canal stenosis. Severe right and moderate left foraminal narrowing. At L5-S1, disc space narrowing and vacuum disc. Moderate facet degenerative changes. Moderate bilateral foraminal narrowing. IMPRESSION: 1. No CT evidence for acute osseous abnormality. 2. Multilevel degenerative changes, most advanced at L3-L4 and L4-L5. 3. Aortic atherosclerosis. Aortic Atherosclerosis (ICD10-I70.0). Electronically Signed   By: Luke Bun M.D.   On: 03/17/2024 22:10   CT ABDOMEN PELVIS W CONTRAST Result Date: 03/17/2024 CLINICAL DATA:  Sepsis EXAM: CT ABDOMEN AND PELVIS WITH CONTRAST TECHNIQUE: Multidetector CT imaging of the abdomen and pelvis was performed using the standard protocol following bolus administration of intravenous contrast. RADIATION DOSE REDUCTION: This exam was performed according to the departmental dose-optimization program which includes automated exposure control, adjustment of the mA and/or kV according to patient size and/or use of iterative reconstruction technique. CONTRAST:  75mL OMNIPAQUE  IOHEXOL  350 MG/ML SOLN COMPARISON:  10/28/2016 FINDINGS: Lower chest: No acute pleural or parenchymal lung disease. Hepatobiliary: No focal liver abnormality is seen. No gallstones, gallbladder wall thickening, or biliary dilatation. Pancreas: Fatty atrophy of the pancreas. No inflammatory changes or pancreatic duct dilation. Spleen: Normal in size without focal abnormality. Adrenals/Urinary Tract: Adrenal glands are unremarkable.  Kidneys are normal, without renal calculi, focal lesion, or hydronephrosis. Bladder is unremarkable. Stomach/Bowel: No bowel obstruction or ileus. Normal appendix. Scattered colonic diverticulosis without diverticulitis. No bowel wall thickening or inflammatory change. Small hiatal hernia.  Vascular/Lymphatic: Aortic atherosclerosis. No enlarged abdominal or pelvic lymph nodes. Reproductive: Status post hysterectomy. No adnexal masses. Other: No free fluid or free intraperitoneal gas. No abdominal wall hernia. Musculoskeletal: No acute or destructive bony abnormalities. Reconstructed images demonstrate no additional findings. IMPRESSION: 1. No acute intra-abdominal or intrapelvic process. 2. Colonic diverticulosis without diverticulitis. 3. Small hiatal hernia. 4.  Aortic Atherosclerosis (ICD10-I70.0). Electronically Signed   By: Ozell Daring M.D.   On: 03/17/2024 22:02   DG Chest 2 View if patient is not in a treatment room. Result Date: 03/17/2024 CLINICAL DATA:  Possible sepsis EXAM: CHEST - 2 VIEW COMPARISON:  10/07/2023 FINDINGS: Cardiac shadow is stable. Lungs are well aerated bilaterally. No bony abnormality is noted. IMPRESSION: No active cardiopulmonary disease. Electronically Signed   By: Oneil Devonshire M.D.   On: 03/17/2024 20:28     Time coordinating discharge: Over 30 minutes    Alm Apo, MD  [facility] 03/19/2024, 1:26 PM

## 2024-03-19 NOTE — Assessment & Plan Note (Signed)
-   transitioned to lisinopril

## 2024-03-19 NOTE — Assessment & Plan Note (Signed)
-   Small subsegmental pulmonary emboli in the posterior right lower lobe  - started on Eliquis on admission x 6 months - appears unprovoked; no obvious risk factors noted aside from obesity; need to clarify family history of VTE further still (poor historian in hospital) - if FH positive for VTE, consider outpatient hypercoag workup after completion of Eliquis

## 2024-03-19 NOTE — Progress Notes (Signed)
 Echocardiogram 2D Echocardiogram has been performed.  Elaine Blair 03/19/2024, 11:42 AM

## 2024-03-19 NOTE — Assessment & Plan Note (Signed)
-   uncontrolled BP on admission - trial of transition to lisinopril - d/c hydralazine and hctz

## 2024-03-19 NOTE — Inpatient Diabetes Management (Signed)
 Inpatient Diabetes Program Recommendations  AACE/ADA: New Consensus Statement on Inpatient Glycemic Control (2015)  Target Ranges:  Prepandial:   less than 140 mg/dL      Peak postprandial:   less than 180 mg/dL (1-2 hours)      Critically ill patients:  140 - 180 mg/dL   Lab Results  Component Value Date   GLUCAP 224 (H) 03/19/2024   HGBA1C 9.3 (H) 01/20/2024    Review of Glycemic Control  Latest Reference Range & Units 03/18/24 11:47 03/18/24 15:37 03/18/24 16:21 03/18/24 21:09 03/19/24 08:45 03/19/24 11:45  Glucose-Capillary 70 - 99 mg/dL 822 (H) 814 (H) 773 (H) 190 (H) 214 (H) 224 (H)  (H): Data is abnormally high  Diabetes history: DM2 Outpatient Diabetes medications: Lantus 20-30 units every day, Humalog 0-25 units TID, Mounjaro  7.5 mg weekly, Dexcom G7 Current orders for Inpatient glycemic control: Semglee 25 units at bedtime, Novolog 0-15 units TID   Met with patient at bedside.  Reviewed patient's A1c of 9.3% from January 20, 2024. Explained what a A1c is and what it measures. Also reviewed goal A1c with patient, importance of good glucose control @ home, and blood sugar goals.   She has not been taking Lantus.  She transitioned from Ozempic to Mounjaro  2 months ago.  She states she has had at least 4 steroid shots in the last 2 months which has elevated her glucose.  When the steroids wear off in a week, she states her glucose trends are very good.  She has lost 20 lbs over the last 2 months with diet and medication.  She does not drink caloric beverages and follows a health carb modified diet.  Correction insulin was held this morning-unclear why.  She wears a Dexcom G7 to monitor her glucose trends.    Thank you, Wyvonna Pinal, MSN, CDCES Diabetes Coordinator Inpatient Diabetes Program [phone] (team pager from 8a-5p)

## 2024-03-19 NOTE — Care Management Obs Status (Signed)
 MEDICARE OBSERVATION STATUS NOTIFICATION   Patient Details  Name: Elaine Blair MRN: [id number] Date of Birth: 10-20-1965   Medicare Observation Status Notification Given:  Yes verbally reviewed observation notice with Kellan King telephonically at [phone]. Will deliver a copy to the patients room.   Arcadia Gorgas 03/19/2024, 8:43 AM

## 2024-03-19 NOTE — Assessment & Plan Note (Signed)
-   per CTA chest:  Dilated ascending aorta measuring 42 mm. Recommend annual imaging followup by CTA or MRA.

## 2024-03-20 ENCOUNTER — Other Ambulatory Visit (HOSPITAL_BASED_OUTPATIENT_CLINIC_OR_DEPARTMENT_OTHER): Payer: Self-pay | Admitting: Student

## 2024-03-20 ENCOUNTER — Telehealth: Payer: Self-pay

## 2024-03-20 ENCOUNTER — Other Ambulatory Visit (HOSPITAL_COMMUNITY): Payer: Self-pay

## 2024-03-20 DIAGNOSIS — E782 Mixed hyperlipidemia: Secondary | ICD-10-CM

## 2024-03-20 NOTE — Transitions of Care (Post Inpatient/ED Visit) (Signed)
 03/20/2024  Name: Elaine Blair MRN: [id number] DOB: 1966/03/27  Today's TOC FU Call Status: Today's TOC FU Call Status:: Successful TOC FU Call Completed TOC FU Call Complete Date: 03/20/24 Patient's Name and Date of Birth confirmed.  Transition Care Management Follow-up Telephone Call Date of Discharge: 03/19/24 Discharge Facility: [facility]) Type of Discharge: Inpatient Admission Primary Inpatient Discharge Diagnosis:: PE How have you been since you were released from the hospital?: Better Any questions or concerns?: No  Items Reviewed: Did you receive and understand the discharge instructions provided?: Yes Medications obtained,verified, and reconciled?: Yes (Medications Reviewed) Any new allergies since your discharge?: No Dietary orders reviewed?: Yes Do you have support at home?: No  Medications Reviewed Today: Medications Reviewed Today     Reviewed by Emmitt Pan, LPN (Licensed Practical Nurse) on 03/20/24 at 1110  Med List Status: <None>   Medication Order Taking? Sig Documenting Provider Last Dose Status Informant  acetaminophen  (TYLENOL ) 650 MG CR tablet [id number] Yes Take 1,300 mg by mouth 2 (two) times daily as needed for pain or fever. [provider]  Active Self, Pharmacy Records  allopurinol  (ZYLOPRIM ) 100 MG tablet [id number] Yes Take 100 mg by mouth daily. [provider]  Active Self, Pharmacy Records  apixaban (ELIQUIS) 5 MG TABS tablet [id number] Yes Take 1 tablet (5 mg total) by mouth 2 (two) times daily. Start on 04/18/2024 after completing starter pack Patsy Lenis, MD  Active   APIXABAN WINN) VTE STARTER PACK (10MG  AND 5MG ) [id number] Yes Take as directed on package: start with two-5mg  tablets twice daily for 7 days. On day 8, switch to one-5mg  tablet twice daily. Patsy Lenis, MD  Active   Ascorbic Acid (VITAMIN C PO) [id number] Yes Take 1 tablet by mouth daily. [provider]  Active Self, Pharmacy  Records  atorvastatin (LIPITOR) 20 MG tablet [id number]  Take 20 mg by mouth daily.  Patient not taking: Reported on 03/20/2024   [provider]  Active Self, Pharmacy Records  BELBUCA  750 MCG FILM [id number] Yes Take 750 mcg by mouth in the morning and at bedtime. Under tongue every 12 hours [provider]  Active Self, Pharmacy Records  Cholecalciferol (VITAMIN D-3 PO) [id number] Yes Take 1 capsule by mouth daily. [provider]  Active Self, Pharmacy Records  Continuous Glucose Receiver [facility] G7 [place]) DEVI [id number] Yes Use alongside sensor to check blood glucose. Rothfuss, Jacob T, PA-C  Active Self, Pharmacy Records  Continuous Glucose Sensor (DEXCOM G7 [place]) [place] [id number] Yes Apply one sensor to upper arm every 10 days Rothfuss, Jacob T, PA-C  Active Self, Pharmacy Records  Cyanocobalamin  (VITAMIN B-12 PO) [id number] Yes Take 1 tablet by mouth daily. [provider]  Active Self, Pharmacy Records  HUMALOG KWIKPEN 100 UNIT/ML KwikPen [id number] Yes Inject 0-25 Units into the skin 4 (four) times daily as needed (hyperglycemia; per sliding scale.). [provider]  Active Self, Pharmacy Records  LANTUS SOLOSTAR 100 UNIT/ML Solostar Pen [id number] Yes Inject 20-30 Units into the skin at bedtime as needed (hyperglycemia; per sliding scale). [provider]  Active Self, Pharmacy Records  lisinopril (ZESTRIL) 40 MG tablet [id number] Yes Take 1 tablet (40 mg total) by mouth daily. Patsy Lenis, MD  Active   MAGNESIUM PO [id number] Yes Take 1 tablet by mouth daily. [provider]  Active Self, Pharmacy Records  Misc Natural Products (BEET ROOT PO) [id number] Yes Take 1 tablet by mouth daily. [provider]  Active Self, Pharmacy Records  Multiple  Vitamins-Minerals (WOMENS MULTIVITAMIN) TABS [id number] Yes Take 1 tablet by mouth daily. [provider]  Active Self, Pharmacy Records  mupirocin ointment (BACTROBAN) 2 %  [id number] Yes Apply 1 Application topically 3 (three) times daily as needed (sores). [provider]  Active Self, Pharmacy Records  NEURONTIN 300 MG capsule [id number] Yes Take 300 mg by mouth 3 (three) times daily as needed (neuropathy). BRAND NAME ONLY [provider]  Active Self, Pharmacy Records  Omega-3 Fatty Acids (FISH OIL PO) [id number] Yes Take 1 capsule by mouth at bedtime. [provider]  Active Self, Pharmacy Records  omeprazole (PRILOSEC) 20 MG capsule [id number] Yes Take 20 mg by mouth daily. [provider]  Active Self, Pharmacy Records  Semaglutide, 2 MG/DOSE, (OZEMPIC, 2 MG/DOSE,) 8 MG/3ML SOPN [id number] Yes Inject 2 mg into the skin every Tuesday. [provider]  Active Self, Pharmacy Records           Med Note (COFFELL, JON HERO   Wed Mar 18, 2024 11:55 AM) Patient endorses taking both Ozempic 2mg  and Mounjaro  7.5mg  every week, per MD orders.  tirzepatide  (MOUNJARO ) 7.5 MG/0.5ML Pen [id number] Yes Inject 7.5 mg into the skin once a week.  Patient taking differently: Inject 7.5 mg into the skin every Monday.   Rothfuss, Jacob T, PA-C  Active Self, Pharmacy Records  tiZANidine (ZANAFLEX) 4 MG tablet [id number] Yes Take 4 mg by mouth every 8 (eight) hours as needed for muscle spasms. [provider]  Active Self, Pharmacy Records  triamcinolone  cream (KENALOG ) 0.1 % [id number] Yes Apply 1 Application topically 2 (two) times daily as needed (skin irritation). to affected area(s) [provider]  Active Self, Pharmacy Records            Home Care and Equipment/Supplies: Were Home Health Services Ordered?: NA Any new equipment or medical supplies ordered?: NA  Functional Questionnaire: Do you need assistance with bathing/showering or dressing?: No Do you need assistance with meal preparation?: No Do you need assistance with eating?: No Do you have difficulty maintaining continence: No Do you need assistance with  getting out of bed/getting out of a chair/moving?: No Do you have difficulty managing or taking your medications?: No  Follow up appointments reviewed: PCP Follow-up appointment confirmed?: Yes Date of PCP follow-up appointment?: 03/26/24 Follow-up Provider: [facility] Follow-up appointment confirmed?: NA Do you need transportation to your follow-up appointment?: No Do you understand care options if your condition(s) worsen?: Yes-patient verbalized understanding    SIGNATURE Julian Lemmings, LPN [facility] Nurse Health Advisor Direct Dial [phone]

## 2024-03-20 NOTE — Telephone Encounter (Signed)
 Copied from CRM [id number]. Topic: Clinical - Medication Refill >> Mar 20, 2024  4:53 PM Shereese L wrote: Medication: triamcinolone  cream (KENALOG ) 0.1 %  Has the patient contacted their pharmacy? Yes (Agent: If no, request that the patient contact the pharmacy for the refill. If patient does not wish to contact the pharmacy document the reason why and proceed with request.) (Agent: If yes, when and what did the pharmacy advise?)  This is the patient's preferred pharmacy:   [facility] [place] [address] [place] [address] Phone: [phone] Fax: [phone]  Is this the correct pharmacy for this prescription? Yes If no, delete pharmacy and type the correct one.   Has the prescription been filled recently? Yes  Is the patient out of the medication? Yes  Has the patient been seen for an appointment in the last year OR does the patient have an upcoming appointment? Yes  Can we respond through MyChart? Yes  Agent: Please be advised that Rx refills may take up to 3 business days. We ask that you follow-up with your pharmacy.

## 2024-03-20 NOTE — Telephone Encounter (Signed)
 Elaine Blair DASEN [facility]   03/20/24 12:23 PM Unsigned Note Copied from CRM #[id number]. Topic: Clinical - Medication Question >> Mar 20, 2024 12:18 PM [name] wrote: Reason for CRM: Patient calling requesting for medication refill for atorvastatin (LIPITOR) 20 MG tablet.  Patient would like to see if pcp can send prescription in for her. She is completely out of medication and states was originally  given to her while in hospital.  Would like for medication to be sent to:   [id number] [place], [place] - [address] AT [facility] [address] [place] [address] Phone: [phone] Fax: [phone]

## 2024-03-20 NOTE — Telephone Encounter (Signed)
 Copied from CRM [id number]. Topic: Clinical - Medication Question >> Mar 20, 2024 12:18 PM [name] wrote: Reason for CRM: Patient calling requesting for medication refill for atorvastatin (LIPITOR) 20 MG tablet.  Patient would like to see if pcp can send prescription in for her. She is completely out of medication and states was originally  given to her while in hospital.  Would like for medication to be sent to:  [id number] [place], [place] - [address] AT [facility] [address] [place] [address] Phone: [phone] Fax: [phone]

## 2024-03-22 LAB — CULTURE, BLOOD (ROUTINE X 2)
Culture: NO GROWTH
Culture: NO GROWTH
Special Requests: ADEQUATE
Special Requests: ADEQUATE

## 2024-03-23 MED ORDER — ATORVASTATIN CALCIUM 20 MG PO TABS: 20.0000 mg | ORAL_TABLET | Freq: Every day | ORAL | 2 refills | Status: DC

## 2024-03-23 MED ORDER — TRIAMCINOLONE ACETONIDE 0.1 % EX CREA: 1.0000 | TOPICAL_CREAM | Freq: Two times a day (BID) | CUTANEOUS | 0 refills | Status: AC | PRN

## 2024-03-24 DIAGNOSIS — Z794 Long term (current) use of insulin: Secondary | ICD-10-CM | POA: Diagnosis not present

## 2024-03-24 DIAGNOSIS — E119 Type 2 diabetes mellitus without complications: Secondary | ICD-10-CM | POA: Diagnosis not present

## 2024-03-26 ENCOUNTER — Inpatient Hospital Stay (HOSPITAL_BASED_OUTPATIENT_CLINIC_OR_DEPARTMENT_OTHER): Admitting: Student

## 2024-03-27 NOTE — Progress Notes (Signed)
 I did check the patient's medical record.  She was admitted to [facility] long on October 21 and discharged on October 23.  She was not septic but did have a pulmonary embolus.  I left her a message that I was checking on her and to please call if she had questions.

## 2024-03-31 ENCOUNTER — Encounter (HOSPITAL_BASED_OUTPATIENT_CLINIC_OR_DEPARTMENT_OTHER): Payer: Self-pay | Admitting: Student

## 2024-04-02 ENCOUNTER — Encounter (HOSPITAL_BASED_OUTPATIENT_CLINIC_OR_DEPARTMENT_OTHER): Payer: Self-pay

## 2024-04-02 ENCOUNTER — Ambulatory Visit (INDEPENDENT_AMBULATORY_CARE_PROVIDER_SITE_OTHER): Admitting: Student

## 2024-04-02 ENCOUNTER — Other Ambulatory Visit (HOSPITAL_BASED_OUTPATIENT_CLINIC_OR_DEPARTMENT_OTHER): Payer: Self-pay

## 2024-04-02 VITALS — BP 137/91 | HR 95 | Temp 99.3°F | Resp 16 | Ht 66.0 in | Wt 262.1 lb

## 2024-04-02 DIAGNOSIS — Z794 Long term (current) use of insulin: Secondary | ICD-10-CM

## 2024-04-02 DIAGNOSIS — J302 Other seasonal allergic rhinitis: Secondary | ICD-10-CM | POA: Diagnosis not present

## 2024-04-02 DIAGNOSIS — E782 Mixed hyperlipidemia: Secondary | ICD-10-CM | POA: Diagnosis not present

## 2024-04-02 DIAGNOSIS — E119 Type 2 diabetes mellitus without complications: Secondary | ICD-10-CM

## 2024-04-02 DIAGNOSIS — Z6841 Body Mass Index (BMI) 40.0 and over, adult: Secondary | ICD-10-CM

## 2024-04-02 DIAGNOSIS — I10 Essential (primary) hypertension: Secondary | ICD-10-CM

## 2024-04-02 DIAGNOSIS — Z86711 Personal history of pulmonary embolism: Secondary | ICD-10-CM

## 2024-04-02 MED ORDER — ROSUVASTATIN CALCIUM 20 MG PO TABS
20.0000 mg | ORAL_TABLET | Freq: Every day | ORAL | 3 refills | Status: DC
  Filled 2024-04-02: qty 30, 30d supply, fill #0
  Filled 2024-04-28: qty 30, 30d supply, fill #1
  Filled 2024-05-29: qty 30, 30d supply, fill #2

## 2024-04-02 MED ORDER — LEVOCETIRIZINE DIHYDROCHLORIDE 5 MG PO TABS
5.0000 mg | ORAL_TABLET | Freq: Every evening | ORAL | 2 refills | Status: DC
  Filled 2024-04-02: qty 30, 30d supply, fill #0
  Filled 2024-04-28: qty 30, 30d supply, fill #1
  Filled 2024-05-29: qty 30, 30d supply, fill #2

## 2024-04-02 MED ORDER — HYDROCHLOROTHIAZIDE 12.5 MG PO CAPS
12.5000 mg | ORAL_CAPSULE | Freq: Every day | ORAL | 3 refills | Status: DC
  Filled 2024-04-02: qty 30, 30d supply, fill #0
  Filled 2024-04-28: qty 30, 30d supply, fill #1
  Filled 2024-05-29: qty 30, 30d supply, fill #2

## 2024-04-02 MED ORDER — TIRZEPATIDE 10 MG/0.5ML ~~LOC~~ SOAJ
10.0000 mg | SUBCUTANEOUS | 6 refills | Status: DC
  Filled 2024-04-02: qty 2, 28d supply, fill #0
  Filled 2024-04-28: qty 2, 28d supply, fill #1

## 2024-04-02 NOTE — Progress Notes (Signed)
 Established Patient Office Visit  Subjective   Patient ID: Elaine Blair, female    DOB: 11/03/1965  Age: 58 y.o. MRN: [id number]  Chief Complaint  Patient presents with   Hospitalization Follow-up    Hospital follow up from 03/17/2024.   Medication Problem    Atorvastatin is causing a lot of joint pain. Requesting alternative. Stopped taking it.    Allergic Reaction    Needs something for allergies. Eyes have been puffy and itching. Tried claritin and a cold rag on right eye.     HPI  Discussed the use of AI scribe software for clinical note transcription with the patient, who gave verbal consent to proceed.  History of Present Illness   Elaine Blair is a 58 year old female who presents for a hospital follow-up after a pulmonary embolism.  She was hospitalized for a pulmonary embolism in October 2025. She had no prior history of blood clots, sedentary lifestyle, cancer, infection, or other known risk factors for clots. Before hospitalization, she experienced a slight elevation in temperature to 99.45F and a small headache that resolved. She was asymptomatic upon arrival at the hospital, but her condition rapidly escalated to an acute state, necessitating immediate medical intervention. During hospitalization, she was treated with anticoagulants, including Eliquis, which she started on March 19, 2024, and will continue for six months.  She has a family history of blood clots; her sister had blood clots due to chemotherapy for ovarian cancer, and her brother had blood clots and mini-strokes, though the cause was unclear. No other family members, including parents and grandparents, have a history of blood clots.  She has a history of sepsis but no recent infections. She also mentions a past overactive thyroid  that resolved without ongoing issues. She is currently experiencing joint issues, which she attributes to atorvastatin, and has been switched to Crestor . She takes CoQ10  to help with muscle aches associated with statin use.  She is concerned about her knee, which has been 'bone on bone for years.' She has been engaging in healthy lifestyle changes and is using a stationary pedal exerciser to improve her physical activity. [place] and enjoys walking, though she is cautious due to her recent health issues.  Her blood pressure has been elevated, with home readings around 130s/80s, but was 155/95 during the visit. She was previously on hydralazine and hydrochlorothiazide, which were discontinued. She is now on lisinopril and will restart hydrochlorothiazide at a low dose.  She has been managing her blood sugar levels with Mounjaro , which she reports is going well. Her blood sugar levels have been in the 100s, occasionally rising to 200s post-meal. She is cautious about steroid use due to its impact on her blood sugar.  She experiences seasonal allergies, with symptoms of itchy eyes, and has been using Claritin with some relief.      CTPA and echo in 3 months? Heme/onc referral in 6 months?  Patient Active Problem List   Diagnosis Date Noted   Ascending aortic aneurysm 03/19/2024   Pulmonary embolism (HCC) 03/18/2024   Carpal tunnel syndrome, right 09/20/2023   IDA (iron deficiency anemia) 03/20/2022   Hypertension secondary to other renal disorders 11/24/2018   Acquired absence of cervix 08/07/2018   Stage 3 chronic kidney disease (HCC) 04/11/2018   Mixed hyperlipidemia 02/12/2017   Chronic gout of multiple sites 02/01/2017   Gastroesophageal reflux disease without esophagitis 02/01/2017   Spondylosis of lumbar spine 02/01/2017   Primary osteoarthritis involving multiple  joints 02/01/2017   Diabetes mellitus type 2, insulin dependent (HCC) 11/16/2016   HTN (hypertension) 11/16/2016   Past Medical History:  Diagnosis Date   Diabetes mellitus without complication (HCC)    Gout    History of abdominal abscess 11/16/2016   Hypertension     Osteoarthritis    Social History   Tobacco Use   Smoking status: Never    Passive exposure: Never   Smokeless tobacco: Never  Vaping Use   Vaping status: Never Used  Substance Use Topics   Alcohol use: No   Drug use: No   Allergies  Allergen Reactions   Gabapentin Swelling    Patient is unable to take generic formulation - she currently takes brand name Neurontin 300mg  TID PRN with no issues.   Latex Hives, Swelling and Rash      ROS Per HPI.    Objective:     BP (!) 137/91   Pulse 95   Temp 99.3 F (37.4 C) (Oral)   Resp 16   Ht 5' 6 (1.676 m)   Wt 262 lb 1.6 oz (118.9 kg)   SpO2 96%   BMI 42.30 kg/m  BP Readings from Last 3 Encounters:  04/02/24 (!) 137/91  03/19/24 130/88  03/16/24 (!) 164/102   Wt Readings from Last 3 Encounters:  04/02/24 262 lb 1.6 oz (118.9 kg)  03/18/24 255 lb (115.7 kg)  03/05/24 263 lb 12.8 oz (119.7 kg)      Physical Exam Constitutional:      General: She is not in acute distress.    Appearance: Normal appearance. She is not ill-appearing, toxic-appearing or diaphoretic.  HENT:     Head: Normocephalic and atraumatic.     Nose: Nose normal.  Eyes:     General: No scleral icterus.    Conjunctiva/sclera: Conjunctivae normal.  Cardiovascular:     Rate and Rhythm: Normal rate and regular rhythm.     Heart sounds: Normal heart sounds. No murmur heard.    No friction rub.  Pulmonary:     Effort: Pulmonary effort is normal. No respiratory distress.     Breath sounds: Normal breath sounds. No wheezing, rhonchi or rales.  Musculoskeletal:        General: Normal range of motion.  Skin:    General: Skin is warm and dry.     Coloration: Skin is not jaundiced or pale.  Neurological:     General: No focal deficit present.     Mental Status: She is alert.  Psychiatric:        Mood and Affect: Mood normal.        Behavior: Behavior normal.      Results for orders placed or performed in visit on 04/02/24  HM COLONOSCOPY   Result Value Ref Range   HM Colonoscopy See Report (in chart) See Report (in chart), Patient Reported  HM COLONOSCOPY  Result Value Ref Range   HM Colonoscopy See Report (in chart) See Report (in chart), Patient Reported    Last CBC Lab Results  Component Value Date   WBC 14.2 (H) 03/19/2024   HGB 12.1 03/19/2024   HCT 37.7 03/19/2024   MCV 82.5 03/19/2024   MCH 26.5 03/19/2024   RDW 15.1 03/19/2024   PLT 438 (H) 03/19/2024   Last metabolic panel Lab Results  Component Value Date   GLUCOSE 204 (H) 03/19/2024   NA 136 03/19/2024   K 4.0 03/19/2024   CL 97 (L) 03/19/2024   CO2 26 03/19/2024  BUN 20 03/19/2024   CREATININE 1.44 (H) 03/19/2024   GFRNONAA 42 (L) 03/19/2024   CALCIUM  9.3 03/19/2024   PROT 8.0 03/17/2024   ALBUMIN 3.5 03/17/2024   LABGLOB 3.1 01/20/2024   BILITOT 0.7 03/17/2024   ALKPHOS 67 03/17/2024   AST 17 03/17/2024   ALT 22 03/17/2024   ANIONGAP 13 03/19/2024   Last lipids Lab Results  Component Value Date   CHOL 211 (H) 01/20/2024   HDL 45 01/20/2024   LDLCALC 125 (H) 01/20/2024   TRIG 231 (H) 01/20/2024   CHOLHDL 4.7 (H) 01/20/2024   Last hemoglobin A1c Lab Results  Component Value Date   HGBA1C 9.3 (H) 01/20/2024      The 10-year ASCVD risk score (Arnett DK, et al., 2019) is: 21.7%    Assessment & Plan:   Assessment and Plan    Pulmonary embolism, on eliquis Acute pulmonary embolism with no prior history of blood clots. Family history of blood clots in sister and brother. No clear etiology identified. Currently on Eliquis for anticoagulation. Plan to evaluate for hypercoagulability after completing Eliquis course. - Continue Eliquis until April 23rd, 2026 - Will refer to hematology for hypercoagulability workup after completing Eliquis - Will plan for repeat imaging in February to assess resolution of embolism and cardiac function  Essential hypertension Chronic, not at goal. Blood pressure slightly elevated at 155/95 mmHg.  Home readings slightly elevated but generally well-controlled. Current regimen includes lisinopril. Plan to adjust medication to achieve better control. - Restarted hydrochlorothiazide 12.5 mg with lisinopril - Monitor blood pressure at home - Will recheck blood pressure in six weeks  Type 2 diabetes mellitus Blood sugar levels monitored during hospitalization, generally in the 100s with occasional spikes. Currently on Mounjaro , with plans to increase dosage for better glycemic control and weight management. - Increased Mounjaro  to 10 mg after completing four pens of 7.5 mg - Will recheck A1c at next visit  Mixed hyperlipidemia Experiencing joint issues with atorvastatin. Plan to switch to Crestor  to manage cholesterol levels and minimize side effects. - Discontinued atorvastatin - Started Crestor  30 mg with refills - Checked last cholesterol panel - Start CoQ10 supplement to mitigate muscle aches  Obesity, BMI 40.0-44.9 Actively working on [employer] management through dietary changes and increased physical activity. Considering PRP therapy for knee pain, pending completion of Eliquis course. - Continue current dietary and exercise regimen - Will consider PRP therapy for knee pain after completing Eliquis  Seasonal allergic rhinitis Experiencing eye itching, possibly due to seasonal allergies or medication adjustment. No current allergy medication in use. - Started Xyzal for allergy symptoms - Advised to take Xyzal at night to minimize drowsiness - Will consider switching to Claritin if Xyzal causes excessive drowsiness  General Health Maintenance Due for routine screenings and health maintenance. - Will schedule mammogram - Will schedule eye exam - Will plan for six-week follow-up for hypertension and diabetes management      Return in about 6 weeks (around 05/14/2024) for DM, HTN.    Lihanna Biever T Joden Bonsall, PA-C

## 2024-04-02 NOTE — Patient Instructions (Addendum)
   It was nice to see you today!  As we discussed in clinic: Please schedule eye exam and mammogram. Please STOP the lipitor and START the crestor  START hydrochlorothiazide 12.5 mg daily  If you have any problems before your next visit feel free to message me via MyChart (minor issues or questions) or call the office, otherwise you may reach out to schedule an office visit.  Thank you! Birdie Fetty, PA-C

## 2024-04-08 ENCOUNTER — Ambulatory Visit (INDEPENDENT_AMBULATORY_CARE_PROVIDER_SITE_OTHER)

## 2024-04-08 ENCOUNTER — Encounter (HOSPITAL_BASED_OUTPATIENT_CLINIC_OR_DEPARTMENT_OTHER): Payer: Self-pay

## 2024-04-08 VITALS — BP 122/76 | Ht 66.0 in | Wt 256.0 lb

## 2024-04-08 DIAGNOSIS — Z0000 Encounter for general adult medical examination without abnormal findings: Secondary | ICD-10-CM | POA: Diagnosis not present

## 2024-04-08 NOTE — Progress Notes (Signed)
 I connected with  Elaine Blair on 04/08/24 by a audio enabled telemedicine application and verified that I am speaking with the correct person using two identifiers.  Patient Location: Home  Provider Location: Home Office  Persons Participating in Visit: Patient.  I discussed the limitations of evaluation and management by telemedicine. The patient expressed understanding and agreed to proceed.  Vital Signs: Because this visit was a virtual/telehealth visit, some criteria may be missing or patient reported. Any vitals not documented were not able to be obtained and vitals that have been documented are patient reported.   Because this visit was a virtual/telehealth visit,  certain criteria was not obtained, such a blood pressure, CBG if applicable, and timed get up and go. Any medications not marked as taking were not mentioned during the medication reconciliation part of the visit. Any vitals not documented were not able to be obtained due to this being a telehealth visit or patient was unable to self-report a recent blood pressure reading due to a lack of equipment at home via telehealth. Vitals that have been documented are verbally provided by the patient.  This visit was performed by a medical professional under my direct supervision. I was immediately available for consultation/collaboration. I have reviewed and agree with the Annual Wellness Visit documentation.  Chief Complaint  Patient presents with   Medicare Wellness     Subjective:   Elaine Blair is a 58 y.o. female who presents for a Medicare Annual Wellness Visit.  Allergies (verified) Gabapentin and Latex   History: Past Medical History:  Diagnosis Date   Diabetes mellitus without complication (HCC)    Gout    History of abdominal abscess 11/16/2016   Hypertension    Osteoarthritis    Past Surgical History:  Procedure Laterality Date   CESAREAN SECTION     x2   SHOULDER SURGERY     shoulder repair    TOTAL ABDOMINAL HYSTERECTOMY Bilateral    Family History  Problem Relation Age of Onset   Hypertension Mother    Diabetes Mother    Tuberculosis Father    Social History   Occupational History   Not on file  Tobacco Use   Smoking status: Never    Passive exposure: Never   Smokeless tobacco: Never  Vaping Use   Vaping status: Never Used  Substance and Sexual Activity   Alcohol use: No   Drug use: No   Sexual activity: Not on file   Tobacco Counseling Counseling given: Not Answered  SDOH Screenings   Food Insecurity: No Food Insecurity (04/08/2024)  Housing: Low Risk  (04/08/2024)  Recent Concern: Housing - High Risk (03/18/2024)  Transportation Needs: No Transportation Needs (04/08/2024)  Utilities: Not At Risk (04/08/2024)  Depression (PHQ2-9): Low Risk  (04/08/2024)  Financial Resource Strain: Low Risk  (04/02/2024)  Physical Activity: Sufficiently Active (04/08/2024)  Social Connections: Moderately Integrated (04/08/2024)  Stress: No Stress Concern Present (04/08/2024)  Tobacco Use: Low Risk  (04/08/2024)  Health Literacy: Adequate Health Literacy (04/08/2024)   See flowsheets for full screening details  Depression Screen PHQ 2 & 9 Depression Scale- Over the past 2 weeks, how often have you been bothered by any of the following problems? Little interest or pleasure in doing things: 0 Feeling down, depressed, or hopeless (PHQ Adolescent also includes...irritable): 0 PHQ-2 Total Score: 0 Trouble falling or staying asleep, or sleeping too much: 0 Feeling tired or having little energy: 0 Poor appetite or overeating (PHQ Adolescent also includes...weight loss): 0  Feeling bad about yourself - or that you are a failure or have let yourself or your family down: 0 Trouble concentrating on things, such as reading the newspaper or watching television (PHQ Adolescent also includes...like school work): 0 Moving or speaking so slowly that other people could have noticed. Or  the opposite - being so fidgety or restless that you have been moving around a lot more than usual: 0 Thoughts that you would be better off dead, or of hurting yourself in some way: 0 PHQ-9 Total Score: 0 If you checked off any problems, how difficult have these problems made it for you to do your work, take care of things at home, or get along with other people?: Not difficult at all  Depression Treatment Depression Interventions/Treatment : EYV7-0 Score <4 Follow-up Not Indicated     Goals Addressed             This Visit's Progress    Patient Stated       To get knee surgery        Visit info / Clinical Intake: Medicare Wellness Visit Type:: Initial Annual Wellness Visit Persons participating in visit:: patient Medicare Wellness Visit Mode:: Telephone If telephone:: video declined Because this visit was a virtual/telehealth visit:: pt reported vitals If Telephone or Video please confirm:: I connected with the patient using audio enabled telemedicine application and verified that I am speaking with the correct person using two identifiers; I discussed the limitations of evaluation and management by telemedicine; The patient expressed understanding and agreed to proceed Patient Location:: home Provider Location:: home office Information given by:: patient Interpreter Needed?: No Pre-visit prep was completed: yes AWV questionnaire completed by patient prior to visit?: no Living arrangements:: (!) lives alone Patient's Overall Health Status Rating: very good Typical amount of pain: none Does pain affect daily life?: no Are you currently prescribed opioids?: no  Dietary Habits and Nutritional Risks How many meals a day?: 2 Eats fruit and vegetables daily?: yes Most meals are obtained by: preparing own meals; eating out In the last 2 weeks, have you had any of the following?: none Diabetic:: no  Functional Status Activities of Daily Living (to include  ambulation/medication): Independent Ambulation: Independent Medication Administration: Independent Home Management: Independent Manage your own finances?: yes Concerns about vision?: no *vision screening is required for WTM* Concerns about hearing?: no  Fall Screening Falls in the past year?: 0 Number of falls in past year: 0 Was there an injury with Fall?: 0 Fall Risk Category Calculator: 0 Patient Fall Risk Level: Low Fall Risk  Fall Risk Patient at Risk for Falls Due to: No Fall Risks Fall risk Follow up: Falls evaluation completed  Home and Transportation Safety: All rugs have non-skid backing?: N/A, no rugs All stairs or steps have railings?: N/A, no stairs Grab bars in the bathtub or shower?: yes Have non-skid surface in bathtub or shower?: yes Good home lighting?: yes Regular seat belt use?: yes Hospital stays in the last year:: no  Cognitive Assessment Difficulty concentrating, remembering, or making decisions? : no Will 6CIT or Mini Cog be Completed: no 6CIT or Mini Cog Declined: patient alert, oriented, able to answer questions appropriately and recall recent events  Advance Directives (For Healthcare) Does Patient Have a Medical Advance Directive?: No Would patient like information on creating a medical advance directive?: No - Patient declined  Reviewed/Updated  Reviewed/Updated: Reviewed All (Medical, Surgical, Family, Medications, Allergies, Care Teams, Patient Goals)        Objective:  Today's Vitals   04/08/24 1110  BP: 122/76  Weight: 256 lb (116.1 kg)  Height: 5' 6 (1.676 m)   Body mass index is 41.32 kg/m.  Current Medications (verified) Outpatient Encounter Medications as of 04/08/2024  Medication Sig   acetaminophen  (TYLENOL ) 650 MG CR tablet Take 1,300 mg by mouth 2 (two) times daily as needed for pain or fever.   allopurinol  (ZYLOPRIM ) 100 MG tablet Take 100 mg by mouth daily.   [START ON 04/18/2024] apixaban (ELIQUIS) 5 MG TABS  tablet Take 1 tablet (5 mg total) by mouth 2 (two) times daily. Start on 04/18/2024 after completing starter pack   APIXABAN (ELIQUIS) VTE STARTER PACK (10MG  AND 5MG ) Take as directed on package: start with two-5mg  tablets twice daily for 7 days. On day 8, switch to one-5mg  tablet twice daily.   Ascorbic Acid (VITAMIN C PO) Take 1 tablet by mouth daily.   BELBUCA  750 MCG FILM Take 750 mcg by mouth in the morning and at bedtime. Under tongue every 12 hours   Berberine Chloride (BERBERINE HCI PO) Take by mouth daily.   Cholecalciferol (VITAMIN D-3 PO) Take 1 capsule by mouth daily.   Continuous Glucose Receiver (DEXCOM G7 RECEIVER) DEVI Use alongside sensor to check blood glucose.   Continuous Glucose Sensor (DEXCOM G7 SENSOR) MISC Apply one sensor to upper arm every 10 days   Cyanocobalamin  (VITAMIN B-12 PO) Take 1 tablet by mouth daily.   Green Tea, Camellia sinensis, (GREEN TEA PO) Take 1 capsule by mouth daily.   HUMALOG KWIKPEN 100 UNIT/ML KwikPen Inject 0-25 Units into the skin 4 (four) times daily as needed (hyperglycemia; per sliding scale.).   hydrochlorothiazide (MICROZIDE) 12.5 MG capsule Take 1 capsule (12.5 mg total) by mouth daily.   LANTUS SOLOSTAR 100 UNIT/ML Solostar Pen Inject 20-30 Units into the skin at bedtime as needed (hyperglycemia; per sliding scale).   levocetirizine (XYZAL) 5 MG tablet Take 1 tablet (5 mg total) by mouth every evening.   lisinopril (ZESTRIL) 40 MG tablet Take 1 tablet (40 mg total) by mouth daily.   MAGNESIUM PO Take 1 tablet by mouth daily.   Misc Natural Products (BEET ROOT PO) Take 1 tablet by mouth daily.   Multiple Vitamins-Minerals (WOMENS MULTIVITAMIN) TABS Take 1 tablet by mouth daily.   mupirocin ointment (BACTROBAN) 2 % Apply 1 Application topically 3 (three) times daily as needed (sores).   NEURONTIN 300 MG capsule Take 300 mg by mouth 3 (three) times daily as needed (neuropathy). BRAND NAME ONLY   omeprazole (PRILOSEC) 20 MG capsule Take 20 mg  by mouth daily.   rosuvastatin  (CRESTOR ) 20 MG tablet Take 1 tablet (20 mg total) by mouth daily.   tirzepatide  (MOUNJARO ) 10 MG/0.5ML Pen Inject 10 mg into the skin once a week.   tiZANidine (ZANAFLEX) 4 MG tablet Take 4 mg by mouth every 8 (eight) hours as needed for muscle spasms.   triamcinolone  cream (KENALOG ) 0.1 % Apply 1 Application topically 2 (two) times daily as needed (skin irritation). to affected area(s)   Omega-3 Fatty Acids (FISH OIL PO) Take 1 capsule by mouth at bedtime. (Patient not taking: Reported on 04/08/2024)   Vitamin D-Vitamin K (VITAMIN K2-VITAMIN D3 PO) Take by mouth daily. (Patient not taking: Reported on 04/08/2024)   No facility-administered encounter medications on file as of 04/08/2024.   Hearing/Vision screen Hearing Screening - Comments:: No difficulties Vision Screening - Comments:: Wears glasses Immunizations and Health Maintenance Health Maintenance  Topic Date Due   Medicare  Annual Wellness (AWV)  Never done   Zoster Vaccines- Shingrix (1 of 2) Never done   Mammogram  Never done   OPHTHALMOLOGY EXAM  10/05/2022   COVID-19 Vaccine (4 - 2025-26 season) 02/05/2025 (Originally 01/27/2024)   HEMOGLOBIN A1C  07/22/2024   Diabetic kidney evaluation - Urine ACR  01/19/2025   FOOT EXAM  02/05/2025   Diabetic kidney evaluation - eGFR measurement  03/19/2025   Colonoscopy  01/12/2027   DTaP/Tdap/Td (2 - Td or Tdap) 11/23/2028   Pneumococcal Vaccine: 50+ Years  Completed   Influenza Vaccine  Completed   Hepatitis C Screening  Completed   HIV Screening  Completed   Hepatitis B Vaccines 19-59 Average Risk  Aged Out   HPV VACCINES  Aged Out   Meningococcal B Vaccine  Aged Out        Assessment/Plan:  This is a routine wellness examination for [place].  Patient Care Team: Rothfuss, Jacob T, PA-C as PCP - General (Physician Assistant)  I have personally reviewed and noted the following in the patient's chart:   Medical and social history Use of alcohol,  tobacco or illicit drugs  Current medications and supplements including opioid prescriptions. Functional ability and status Nutritional status Physical activity Advanced directives List of other physicians Hospitalizations, surgeries, and ER visits in previous 12 months Vitals Screenings to include cognitive, depression, and falls Referrals and appointments  No orders of the defined types were placed in this encounter.  In addition, I have reviewed and discussed with patient certain preventive protocols, quality metrics, and best practice recommendations. A written personalized care plan for preventive services as well as general preventive health recommendations were provided to patient.   Lyle MARLA Right, [place]   04/08/2024   No follow-ups on file.  After Visit Summary: (MyChart) Due to this being a telephonic visit, the after visit summary with patients personalized plan was offered to patient via MyChart   Nurse Notes: nothing to be reported

## 2024-04-08 NOTE — Patient Instructions (Signed)
 Elaine Blair,  Thank you for taking the time for your Medicare Wellness Visit. I appreciate your continued commitment to your health goals. Please review the care plan we discussed, and feel free to reach out if I can assist you further.  Please note that Annual Wellness Visits do not include a physical exam. Some assessments may be limited, especially if the visit was conducted virtually. If needed, we may recommend an in-person follow-up with your provider.  Ongoing Care Seeing your primary care provider every 3 to 6 months helps us  monitor your health and provide consistent, personalized care.   Referrals If a referral was made during today's visit and you haven't received any updates within two weeks, please contact the referred provider directly to check on the status.  Recommended Screenings:  Health Maintenance  Topic Date Due   Medicare Annual Wellness Visit  Never done   Zoster (Shingles) Vaccine (1 of 2) Never done   Breast Cancer Screening  Never done   Eye exam for diabetics  10/05/2022   COVID-19 Vaccine (4 - 2025-26 season) 02/05/2025*   Hemoglobin A1C  07/22/2024   Yearly kidney health urinalysis for diabetes  01/19/2025   Complete foot exam   02/05/2025   Yearly kidney function blood test for diabetes  03/19/2025   Colon Cancer Screening  01/12/2027   DTaP/Tdap/Td vaccine (2 - Td or Tdap) 11/23/2028   Pneumococcal Vaccine for age over 32  Completed   Flu Shot  Completed   Hepatitis C Screening  Completed   HIV Screening  Completed   Hepatitis B Vaccine  Aged Out   HPV Vaccine  Aged Out   Meningitis B Vaccine  Aged Out  *Topic was postponed. The date shown is not the original due date.       04/08/2024   11:15 AM  Advanced Directives  Does Patient Have a Medical Advance Directive? No  Would patient like information on creating a medical advance directive? No - Patient declined    Vision: Annual vision screenings are recommended for early detection of glaucoma,  cataracts, and diabetic retinopathy. These exams can also reveal signs of chronic conditions such as diabetes and high blood pressure.  Dental: Annual dental screenings help detect early signs of oral cancer, gum disease, and other conditions linked to overall health, including heart disease and diabetes.  Please see the attached documents for additional preventive care recommendations.

## 2024-04-21 ENCOUNTER — Telehealth (HOSPITAL_BASED_OUTPATIENT_CLINIC_OR_DEPARTMENT_OTHER): Payer: Self-pay | Admitting: Student

## 2024-04-21 NOTE — Telephone Encounter (Signed)
 Pt said she is only taking 100mg . Is this ok to keep the same?

## 2024-04-21 NOTE — Telephone Encounter (Signed)
 Copied from CRM [id number]. Topic: Clinical - Medication Question >> Apr 21, 2024  3:00 PM Elaine Blair wrote: Reason for CRM: Jeneen from [facility] is calling to confirm dosage on allopurinol . Previous dr had pt on 100mg  and Dr. Iven has prescribed 200mg . Pharm would like to confirm change in dosage. Please advise [phone]

## 2024-04-22 NOTE — Telephone Encounter (Signed)
 Left detailed msg on VM to advise pt is taking allopurinol  100mg  1 tablet once a day. Also left our information for a call back if needed.

## 2024-04-28 ENCOUNTER — Other Ambulatory Visit (HOSPITAL_BASED_OUTPATIENT_CLINIC_OR_DEPARTMENT_OTHER): Payer: Self-pay

## 2024-05-04 NOTE — Progress Notes (Signed)
 Elaine Blair                                          MRN: [id number]   05/04/2024   The VBCI Quality Team Specialist reviewed this patient medical record for the purposes of chart review for care gap closure. The following were reviewed: chart review for care gap closure-glycemic status assessment.    VBCI Quality Team

## 2024-05-08 ENCOUNTER — Other Ambulatory Visit (HOSPITAL_BASED_OUTPATIENT_CLINIC_OR_DEPARTMENT_OTHER): Payer: Self-pay | Admitting: Student

## 2024-05-08 ENCOUNTER — Encounter (HOSPITAL_BASED_OUTPATIENT_CLINIC_OR_DEPARTMENT_OTHER): Payer: Self-pay | Admitting: Student

## 2024-05-08 DIAGNOSIS — M150 Primary generalized (osteo)arthritis: Secondary | ICD-10-CM

## 2024-05-08 MED ORDER — DICLOFENAC SODIUM 1 % EX GEL: 2.0000 g | Freq: Four times a day (QID) | CUTANEOUS | 6 refills | Status: AC

## 2024-05-14 ENCOUNTER — Ambulatory Visit (HOSPITAL_BASED_OUTPATIENT_CLINIC_OR_DEPARTMENT_OTHER): Admitting: Student

## 2024-05-14 ENCOUNTER — Encounter (HOSPITAL_BASED_OUTPATIENT_CLINIC_OR_DEPARTMENT_OTHER): Payer: Self-pay | Admitting: Student

## 2024-05-14 ENCOUNTER — Encounter (HOSPITAL_BASED_OUTPATIENT_CLINIC_OR_DEPARTMENT_OTHER): Payer: Self-pay

## 2024-05-14 VITALS — BP 144/86 | HR 99 | Temp 98.6°F | Resp 16 | Ht 66.0 in | Wt 265.5 lb

## 2024-05-14 DIAGNOSIS — Z794 Long term (current) use of insulin: Secondary | ICD-10-CM

## 2024-05-14 DIAGNOSIS — G2581 Restless legs syndrome: Secondary | ICD-10-CM | POA: Diagnosis not present

## 2024-05-14 DIAGNOSIS — Z86711 Personal history of pulmonary embolism: Secondary | ICD-10-CM | POA: Insufficient documentation

## 2024-05-14 DIAGNOSIS — E782 Mixed hyperlipidemia: Secondary | ICD-10-CM

## 2024-05-14 DIAGNOSIS — I10 Essential (primary) hypertension: Secondary | ICD-10-CM | POA: Diagnosis not present

## 2024-05-14 DIAGNOSIS — N183 Chronic kidney disease, stage 3 unspecified: Secondary | ICD-10-CM

## 2024-05-14 DIAGNOSIS — E119 Type 2 diabetes mellitus without complications: Secondary | ICD-10-CM

## 2024-05-14 DIAGNOSIS — Z6841 Body Mass Index (BMI) 40.0 and over, adult: Secondary | ICD-10-CM | POA: Diagnosis not present

## 2024-05-14 MED ORDER — TIRZEPATIDE 12.5 MG/0.5ML ~~LOC~~ SOAJ
12.5000 mg | SUBCUTANEOUS | 6 refills | Status: DC
  Filled 2024-05-14: qty 2, 28d supply, fill #0
  Filled 2024-05-29: qty 2, 28d supply, fill #1
  Filled ????-??-??: fill #1

## 2024-05-14 MED ORDER — TIRZEPATIDE 12.5 MG/0.5ML ~~LOC~~ SOAJ: 12.5000 mg | SUBCUTANEOUS | 6 refills | Status: DC

## 2024-05-14 NOTE — Progress Notes (Unsigned)
 Established Patient Office Visit  Subjective   Patient ID: Elaine Blair, female    DOB: Dec 27, 1965  Age: 58 y.o. MRN: [id number]  Chief Complaint  Patient presents with   Medical Management of Chronic Issues    Follow up.     HPI  Discussed the use of AI scribe software for clinical note transcription with the patient, who gave verbal consent to proceed.  History of Present Illness          Patient Active Problem List   Diagnosis Date Noted   Ascending aortic aneurysm 03/19/2024   Pulmonary embolism (HCC) 03/18/2024   Carpal tunnel syndrome, right 09/20/2023   IDA (iron deficiency anemia) 03/20/2022   Hypertension secondary to other renal disorders 11/24/2018   Acquired absence of cervix 08/07/2018   Stage 3 chronic kidney disease (HCC) 04/11/2018   Mixed hyperlipidemia 02/12/2017   Chronic gout of multiple sites 02/01/2017   Gastroesophageal reflux disease without esophagitis 02/01/2017   Spondylosis of lumbar spine 02/01/2017   Primary osteoarthritis involving multiple joints 02/01/2017   Diabetes mellitus type 2, insulin  dependent (HCC) 11/16/2016   HTN (hypertension) 11/16/2016   Past Medical History:  Diagnosis Date   Diabetes mellitus without complication (HCC)    Gout    History of abdominal abscess 11/16/2016   Hypertension    Osteoarthritis    Social History[1] Allergies[2]    ROS Per HPI.    Objective:     BP (!) 145/81   Pulse 99   Temp 98.6 F (37 C) (Oral)   Resp 16   Ht 5' 6 (1.676 m)   Wt 265 lb 8 oz (120.4 kg)   SpO2 95%   BMI 42.85 kg/m  BP Readings from Last 3 Encounters:  05/14/24 (!) 145/81  04/08/24 122/76  04/02/24 (!) 137/91   Wt Readings from Last 3 Encounters:  05/14/24 265 lb 8 oz (120.4 kg)  04/08/24 256 lb (116.1 kg)  04/02/24 262 lb 1.6 oz (118.9 kg)   SpO2 Readings from Last 3 Encounters:  05/14/24 95%  04/02/24 96%  03/19/24 98%      Physical Exam   No results found for any visits on  05/14/24.  Last CBC Lab Results  Component Value Date   WBC 14.2 (H) 03/19/2024   HGB 12.1 03/19/2024   HCT 37.7 03/19/2024   MCV 82.5 03/19/2024   MCH 26.5 03/19/2024   RDW 15.1 03/19/2024   PLT 438 (H) 03/19/2024   Last metabolic panel Lab Results  Component Value Date   GLUCOSE 204 (H) 03/19/2024   NA 136 03/19/2024   K 4.0 03/19/2024   CL 97 (L) 03/19/2024   CO2 26 03/19/2024   BUN 20 03/19/2024   CREATININE 1.44 (H) 03/19/2024   GFRNONAA 42 (L) 03/19/2024   CALCIUM  9.3 03/19/2024   PROT 8.0 03/17/2024   ALBUMIN 3.5 03/17/2024   LABGLOB 3.1 01/20/2024   BILITOT 0.7 03/17/2024   ALKPHOS 67 03/17/2024   AST 17 03/17/2024   ALT 22 03/17/2024   ANIONGAP 13 03/19/2024   Last lipids Lab Results  Component Value Date   CHOL 211 (H) 01/20/2024   HDL 45 01/20/2024   LDLCALC 125 (H) 01/20/2024   TRIG 231 (H) 01/20/2024   CHOLHDL 4.7 (H) 01/20/2024   Last hemoglobin A1c Lab Results  Component Value Date   HGBA1C 9.3 (H) 01/20/2024      The 10-year ASCVD risk score (Arnett DK, et al., 2019) is: 25.2%    Assessment &  Plan:   BMI 40.0-44.9, adult (HCC)    Assessment and Plan              No follow-ups on file.    Pariss Hommes T Desmen Schoffstall, PA-C    [1]  Social History Tobacco Use   Smoking status: Never    Passive exposure: Never   Smokeless tobacco: Never  Vaping Use   Vaping status: Never Used  Substance Use Topics   Alcohol use: No   Drug use: No  [2]  Allergies Allergen Reactions   Gabapentin  Swelling    Patient is unable to take generic formulation - she currently takes brand name Neurontin  300mg  TID PRN with no issues.   Latex Hives, Swelling and Rash

## 2024-05-14 NOTE — Patient Instructions (Addendum)
 It was nice to see you today!  As we discussed in clinic: - Please schedule your eye exam and mammogram.  I suggest keeping something called Bleed Stop on hand just in case you have any bleeds that are hard to control.  It comes in individual packets and you should be able to buy this on [employer] for around $15.    If you have any problems before your next visit feel free to message me via MyChart (minor issues or questions) or call the office, otherwise you may reach out to schedule an office visit.  Thank you! Jojo Geving, PA-C

## 2024-05-15 LAB — COMPREHENSIVE METABOLIC PANEL WITH GFR
ALT: 16 IU/L (ref 0–32)
AST: 18 IU/L (ref 0–40)
Albumin: 4.1 g/dL (ref 3.8–4.9)
Alkaline Phosphatase: 65 IU/L (ref 49–135)
BUN/Creatinine Ratio: 16 (ref 12–28)
BUN: 23 mg/dL (ref 8–27)
Bilirubin Total: 0.4 mg/dL (ref 0.0–1.2)
CO2: 25 mmol/L (ref 20–29)
Calcium: 9.4 mg/dL (ref 8.7–10.3)
Chloride: 102 mmol/L (ref 96–106)
Creatinine, Ser: 1.4 mg/dL — ABNORMAL HIGH (ref 0.57–1.00)
Globulin, Total: 2.7 g/dL (ref 1.5–4.5)
Glucose: 190 mg/dL — ABNORMAL HIGH (ref 70–99)
Potassium: 3.8 mmol/L (ref 3.5–5.2)
Sodium: 142 mmol/L (ref 134–144)
Total Protein: 6.8 g/dL (ref 6.0–8.5)
eGFR: 43 mL/min/1.73 — ABNORMAL LOW

## 2024-05-15 LAB — LIPID PANEL
Chol/HDL Ratio: 4 ratio (ref 0.0–4.4)
Cholesterol, Total: 104 mg/dL (ref 100–199)
HDL: 26 mg/dL — ABNORMAL LOW
LDL Chol Calc (NIH): 42 mg/dL (ref 0–99)
Triglycerides: 229 mg/dL — ABNORMAL HIGH (ref 0–149)
VLDL Cholesterol Cal: 36 mg/dL (ref 5–40)

## 2024-05-15 LAB — CBC WITH DIFFERENTIAL/PLATELET
Basophils Absolute: 0.1 x10E3/uL (ref 0.0–0.2)
Basos: 1 %
EOS (ABSOLUTE): 0.4 x10E3/uL (ref 0.0–0.4)
Eos: 4 %
Hematocrit: 33.3 % — ABNORMAL LOW (ref 34.0–46.6)
Hemoglobin: 10.5 g/dL — ABNORMAL LOW (ref 11.1–15.9)
Immature Grans (Abs): 0 x10E3/uL (ref 0.0–0.1)
Immature Granulocytes: 0 %
Lymphocytes Absolute: 3.2 x10E3/uL — ABNORMAL HIGH (ref 0.7–3.1)
Lymphs: 28 %
MCH: 26.6 pg (ref 26.6–33.0)
MCHC: 31.5 g/dL (ref 31.5–35.7)
MCV: 85 fL (ref 79–97)
Monocytes Absolute: 0.7 x10E3/uL (ref 0.1–0.9)
Monocytes: 7 %
Neutrophils Absolute: 6.9 x10E3/uL (ref 1.4–7.0)
Neutrophils: 60 %
Platelets: 398 x10E3/uL (ref 150–450)
RBC: 3.94 x10E6/uL (ref 3.77–5.28)
RDW: 13 % (ref 11.7–15.4)
WBC: 11.4 x10E3/uL — ABNORMAL HIGH (ref 3.4–10.8)

## 2024-05-15 LAB — IRON,TIBC AND FERRITIN PANEL
Ferritin: 39 ng/mL (ref 15–150)
Iron Saturation: 16 % (ref 15–55)
Iron: 51 ug/dL (ref 27–159)
Total Iron Binding Capacity: 322 ug/dL (ref 250–450)
UIBC: 271 ug/dL (ref 131–425)

## 2024-05-15 LAB — HEMOGLOBIN A1C
Est. average glucose Bld gHb Est-mCnc: 194 mg/dL
Hgb A1c MFr Bld: 8.4 % — ABNORMAL HIGH (ref 4.8–5.6)

## 2024-05-15 LAB — MAGNESIUM: Magnesium: 2.4 mg/dL — ABNORMAL HIGH (ref 1.6–2.3)

## 2024-05-18 ENCOUNTER — Ambulatory Visit (HOSPITAL_BASED_OUTPATIENT_CLINIC_OR_DEPARTMENT_OTHER): Payer: Self-pay | Admitting: Student

## 2024-05-18 NOTE — Progress Notes (Signed)
 Elaine Blair                                          MRN: [id number]   05/18/2024   The VBCI Quality Team Specialist reviewed this patient medical record for the purposes of chart review for care gap closure. The following were reviewed: abstraction for care gap closure-glycemic status assessment.    VBCI Quality Team

## 2024-05-19 ENCOUNTER — Other Ambulatory Visit (HOSPITAL_BASED_OUTPATIENT_CLINIC_OR_DEPARTMENT_OTHER): Payer: Self-pay

## 2024-05-19 ENCOUNTER — Ambulatory Visit: Payer: Self-pay

## 2024-05-19 ENCOUNTER — Other Ambulatory Visit (HOSPITAL_BASED_OUTPATIENT_CLINIC_OR_DEPARTMENT_OTHER): Payer: Self-pay | Admitting: Student

## 2024-05-19 ENCOUNTER — Encounter (HOSPITAL_BASED_OUTPATIENT_CLINIC_OR_DEPARTMENT_OTHER): Payer: Self-pay | Admitting: Student

## 2024-05-19 MED ORDER — APIXABAN 5 MG PO TABS
5.0000 mg | ORAL_TABLET | Freq: Two times a day (BID) | ORAL | 5 refills | Status: DC
  Filled 2024-05-19 – 2024-05-29 (×2): qty 60, 30d supply, fill #0
  Filled ????-??-??: fill #0

## 2024-05-19 NOTE — Telephone Encounter (Signed)
 Patient/caller refused triage.  Reason for refusal: med refill request.   Message from Hadassah PARAS sent at 05/19/2024  3:19 PM EST  Reason for Triage: refill for Eloquist has been sent. Please advise pt on # [phone]   Reason for Disposition  [1] Prescription refill request for ESSENTIAL medicine (i.e., likelihood of harm to patient if not taken) AND [2] triager unable to refill per department policy  Answer Assessment - Initial Assessment Questions 1. DRUG NAME: What medicine do you need to have refilled?    Eliquis   This clinical research associate returned call to patient as requested in CRM message, patient stated she did not need a nurse triage call back, she had requested call back to let her know when the medication refill was ready. She also states she spoke with community pharmacy and her refill will be ready within one hour. No further needs at this time.  Protocols used: Medication Refill and Renewal Call-A-AH

## 2024-05-19 NOTE — Telephone Encounter (Signed)
 Copied from CRM [id number]. Topic: Clinical - Medication Refill >> May 19, 2024  3:12 PM Hadassah PARAS wrote: Medication: apixaban  (ELIQUIS ) 5 MG TABS tablet   Has the patient contacted their pharmacy? No (Agent: If no, request that the patient contact the pharmacy for the refill. If patient does not wish to contact the pharmacy document the reason why and proceed with request.) (Agent: If yes, when and what did the pharmacy advise?)  [facility] [address], [address], [place] [address] Phone: [phone]  Fax: [phone]   This is the patient's preferred pharmacy: No Pharmacies Listed Is this the correct pharmacy for this prescription? Yes If no, delete pharmacy and type the correct one.   Has the prescription been filled recently? No  Is the patient out of the medication? No  Has the patient been seen for an appointment in the last year OR does the patient have an upcoming appointment? Yes  Can we respond through MyChart? Yes  Agent: Please be advised that Rx refills may take up to 3 business days. We ask that you follow-up with your pharmacy.

## 2024-05-20 ENCOUNTER — Other Ambulatory Visit (HOSPITAL_BASED_OUTPATIENT_CLINIC_OR_DEPARTMENT_OTHER): Payer: Self-pay

## 2024-05-27 ENCOUNTER — Ambulatory Visit: Payer: Self-pay

## 2024-05-27 NOTE — Telephone Encounter (Signed)
 FYI Only or Action Required?: FYI only for provider: UC advised .  Patient was last seen in primary care on 05/14/2024 by Rothfuss, Lang DASEN, PA-C.  Called Nurse Triage reporting Sore Throat.  Symptoms began several days ago.  Interventions attempted: Other: cough drops , tea .  Symptoms are: gradually worsening.  Triage Disposition: See Physician Within 24 Hours  Patient/caregiver understands and will follow disposition?: Yes       Reason for Disposition  SEVERE throat pain (e.g., excruciating)  Answer Assessment - Initial Assessment Questions Patient reports started 2 days ago scratchy throat. Congested some. Wondering if draining.  Blood clot in October on blood thinner. Appetite is not great had tea throat soothing did help some , extremely sore with swallowing constantly   clearing throat . Possible low grade fever 2 nights ago . Grand kids RSV. No avalible appointments patient  this RN advised to seek UC patient agreeable and going to UC now       1. ONSET: When did the throat start hurting? (Hours or days ago)      2 days ago  2. SEVERITY: How bad is the sore throat? (Scale 1-10; mild, moderate or severe)     2-3/10 right now this morning was worse 8/10  3. STREP EXPOSURE: Has there been any exposure to strep within the past week? If Yes, ask: What type of contact occurred?      Unknown  4.  VIRAL SYMPTOMS: Are there any symptoms of a cold, such as a runny nose, cough, hoarse voice or red eyes?      Congestion  5. FEVER: Do you have a fever? If Yes, ask: What is your temperature, how was it measured, and when did it start?     Possible low grade  6. PUS ON THE TONSILS: Is there pus on the tonsils in the back of your throat?     Couldn't tell  7. OTHER SYMPTOMS: Do you have any other symptoms? (e.g., difficulty breathing, headache, rash)     Congestion   Patient denies the following chest pain, difficulty breathing, inability to  swallow  Protocols used: Sore Throat-A-AH Copied from CRM #[id number]. Topic: Clinical - Red Word Triage >> May 27, 2024 12:23 PM Antwanette L wrote: Red Word that prompted transfer to Nurse Triage: The patient reports that her throat has been bothering her for the past two days. She is experiencing scratchiness and some pain. The patient is requesting medication.

## 2024-05-29 ENCOUNTER — Encounter (HOSPITAL_BASED_OUTPATIENT_CLINIC_OR_DEPARTMENT_OTHER): Payer: Self-pay

## 2024-05-29 ENCOUNTER — Other Ambulatory Visit (HOSPITAL_BASED_OUTPATIENT_CLINIC_OR_DEPARTMENT_OTHER): Payer: Self-pay

## 2024-05-29 ENCOUNTER — Ambulatory Visit (HOSPITAL_BASED_OUTPATIENT_CLINIC_OR_DEPARTMENT_OTHER)
Admission: EM | Admit: 2024-05-29 | Discharge: 2024-05-29 | Disposition: A | Discharge status: Home / Self Care | Attending: Family Medicine | Admitting: Family Medicine

## 2024-05-29 ENCOUNTER — Ambulatory Visit: Payer: Self-pay

## 2024-05-29 DIAGNOSIS — J019 Acute sinusitis, unspecified: Secondary | ICD-10-CM

## 2024-05-29 LAB — POCT INFLUENZA A/B
Influenza A, POC: NEGATIVE
Influenza B, POC: NEGATIVE

## 2024-05-29 MED ORDER — AMOXICILLIN 875 MG PO TABS
875.0000 mg | ORAL_TABLET | Freq: Two times a day (BID) | ORAL | 0 refills | Status: AC
  Filled 2024-05-29: qty 14, 7d supply, fill #0

## 2024-05-29 NOTE — Telephone Encounter (Signed)
 FYI Only or Action Required?: FYI only for provider: Urgent Care is advised due to no openings in PCP office or the offices in the surrounding region.  Patient was last seen in primary care on 05/14/2024 by Rothfuss, Lang DASEN, PA-C.  Called Nurse Triage reporting Cough.  Symptoms began several days ago.  Interventions attempted: OTC medications: Sudafed, Vit C, Tea, Elderberry, Tylenol  and Rest, hydration, or home remedies.  Symptoms are: gradually worsening.  Triage Disposition: See Physician Within 24 Hours  Patient/caregiver understands and will follow disposition?: [facility]                Copied from CRM [id number]. Topic: Clinical - Red Word Triage >> May 29, 2024 10:05 AM Wess RAMAN wrote: Red Word that prompted transfer to Nurse Triage: Itching in ear and throat and soreness, chills  Would like an appt with PCP Reason for Disposition  [1] Continuous (nonstop) coughing interferes with work or school AND [2] no improvement using cough treatment per Care Advice  Answer Assessment - Initial Assessment Questions Sinus drainage, itchy ears, sore throat, cough-started being productive yesterday  yellowish in color, chills X 3-4 days --Sudafed, Vit C, Tea, Elderberry, Tylenol   Patient denies any fever, coughing up blood, chest pain, difficulty breathing,   Patient states her grandson has RSV and an ear infection.  Patient does have a history of a blood clot in October in right lung---small and in right lung Patient is on a blood thinner  There are no available appointments at the patient's PCP office or within the surrounding offices within the region.  Patient is advised Urgent Care at this time.  Patient agreed to go to Urgent Care at this time. Patient is advised to call back if anything changes or if they have any further questions/concerns.  Patient is also advised that if symptoms worse, to go to the Emergency Room or call 911.  Patient verbalized  understanding.  Protocols used: Cough - Acute Productive-A-AH

## 2024-05-29 NOTE — ED Provider Notes (Signed)
 " [name]    CSN: [id number] Arrival date & time: 05/29/24  1338      History   Chief Complaint Chief Complaint  Patient presents with   Ear itching   Sore Throat   Chills   Fatigue   Cough    HPI Elaine Blair is a 59 y.o. female.   Pt is a 59 year old female that presents with cough, ears itching, nasal drainage, sore throat x 3-4 days. Taking otc cold and flu.    Sore Throat  Cough   Past Medical History:  Diagnosis Date   Diabetes mellitus without complication (HCC)    Gout    History of abdominal abscess 11/16/2016   Hypertension    Osteoarthritis     Patient Active Problem List   Diagnosis Date Noted   BMI 40.0-44.9, adult (HCC) 05/14/2024   History of pulmonary embolus (PE) 05/14/2024   Morbid obesity (HCC) 05/14/2024   Ascending aortic aneurysm 03/19/2024   Pulmonary embolism (HCC) 03/18/2024   Carpal tunnel syndrome, right 09/20/2023   IDA (iron deficiency anemia) 03/20/2022   Hypertension secondary to other renal disorders 11/24/2018   Acquired absence of cervix 08/07/2018   Stage 3 chronic kidney disease (HCC) 04/11/2018   Mixed hyperlipidemia 02/12/2017   Chronic gout of multiple sites 02/01/2017   Gastroesophageal reflux disease without esophagitis 02/01/2017   Spondylosis of lumbar spine 02/01/2017   Primary osteoarthritis involving multiple joints 02/01/2017   Diabetes mellitus type 2, insulin  dependent (HCC) 11/16/2016   HTN (hypertension) 11/16/2016    Past Surgical History:  Procedure Laterality Date   CESAREAN SECTION     x2   SHOULDER SURGERY     shoulder repair   TOTAL ABDOMINAL HYSTERECTOMY Bilateral     OB History   No obstetric history on file.      Home Medications    Prior to Admission medications  Medication Sig Start Date End Date Taking? Authorizing Provider  amoxicillin  (AMOXIL ) 875 MG tablet Take 1 tablet (875 mg total) by mouth 2 (two) times daily for 7 days. 05/29/24 06/05/24 Yes Welford Christmas  A, FNP  acetaminophen  (TYLENOL ) 650 MG CR tablet Take 1,300 mg by mouth 2 (two) times daily as needed for pain or fever.    [provider]  allopurinol  (ZYLOPRIM ) 100 MG tablet Take 100 mg by mouth daily.    [provider]  apixaban  (ELIQUIS ) 5 MG TABS tablet Take 1 tablet (5 mg total) by mouth 2 (two) times daily. Start on 04/18/2024 after completing starter pack 05/19/24   Rothfuss, Jacob T, PA-C  Ascorbic Acid (VITAMIN C PO) Take 1 tablet by mouth daily.    [provider]  BELBUCA  750 MCG FILM Take 750 mcg by mouth in the morning and at bedtime. Under tongue every 12 hours    [provider]  Berberine Chloride (BERBERINE HCI PO) Take by mouth daily.    [provider]  Cholecalciferol (VITAMIN D-3 PO) Take 1 capsule by mouth daily.    [provider]  Continuous Glucose Receiver (DEXCOM G7 RECEIVER) DEVI Use alongside sensor to check blood glucose. 01/23/24   Rothfuss, Jacob T, PA-C  Continuous Glucose Sensor (DEXCOM G7 SENSOR) MISC Apply one sensor to upper arm every 10 days 01/23/24   Rothfuss, Jacob T, PA-C  Cyanocobalamin  (VITAMIN B-12 PO) Take 1 tablet by mouth daily.    [provider]  diclofenac  Sodium (VOLTAREN ) 1 % GEL Apply 2 g topically 4 (four) times  daily. For use as needed. Patient not taking: Reported on 05/14/2024 05/08/24   Rothfuss, Jacob T, PA-C  Green Tea, Camellia sinensis, (GREEN TEA PO) Take 1 capsule by mouth daily.    [provider]  HUMALOG KWIKPEN 100 UNIT/ML KwikPen Inject 0-25 Units into the skin 4 (four) times daily as needed (hyperglycemia; per sliding scale.). 03/01/21   [provider]  hydrochlorothiazide  (MICROZIDE ) 12.5 MG capsule Take 1 capsule (12.5 mg total) by mouth daily. 04/02/24   Rothfuss, Jacob T, PA-C  LANTUS  SOLOSTAR 100 UNIT/ML Solostar Pen Inject 20-30 Units into the skin at bedtime as needed (hyperglycemia; per sliding scale).    [provider]   levocetirizine (XYZAL ) 5 MG tablet Take 1 tablet (5 mg total) by mouth every evening. 04/02/24   Rothfuss, Jacob T, PA-C  lisinopril  (ZESTRIL ) 40 MG tablet Take 1 tablet (40 mg total) by mouth daily. 03/20/24   Patsy Lenis, MD  MAGNESIUM PO Take 1 tablet by mouth daily.    [provider]  Misc Natural Products (BEET ROOT PO) Take 1 tablet by mouth daily.    [provider]  Multiple Vitamins-Minerals (WOMENS MULTIVITAMIN) TABS Take 1 tablet by mouth daily.    [provider]  mupirocin ointment (BACTROBAN) 2 % Apply 1 Application topically 3 (three) times daily as needed (sores).    [provider]  NEURONTIN  300 MG capsule Take 300 mg by mouth 3 (three) times daily as needed (neuropathy). BRAND NAME ONLY 01/11/24   [provider]  Omega-3 Fatty Acids (FISH OIL PO) Take 1 capsule by mouth at bedtime.    [provider]  omeprazole (PRILOSEC) 20 MG capsule Take 20 mg by mouth daily. 02/28/21   [provider]  rosuvastatin  (CRESTOR ) 20 MG tablet Take 1 tablet (20 mg total) by mouth daily. 04/02/24   Rothfuss, Jacob T, PA-C  tirzepatide  (MOUNJARO ) 12.5 MG/0.5ML Pen Inject 12.5 mg into the skin once a week. 05/14/24   Rothfuss, Jacob T, PA-C  tiZANidine (ZANAFLEX) 4 MG tablet Take 4 mg by mouth every 8 (eight) hours as needed for muscle spasms. 01/17/24   [provider]  triamcinolone  cream (KENALOG ) 0.1 % Apply 1 Application topically 2 (two) times daily as needed (skin irritation). to affected area(s) 03/23/24   Rothfuss, Jacob T, PA-C  Vitamin D-Vitamin K (VITAMIN K2-VITAMIN D3 PO) Take by mouth daily.    [provider]    Family History Family History  Problem Relation Age of Onset   Hypertension Mother    Diabetes Mother    Tuberculosis Father     Social History Social History[1]   Allergies   Gabapentin  and Latex   Review of Systems Review of Systems  Respiratory:  Positive for cough.       Physical Exam Triage Vital Signs ED Triage Vitals  Encounter Vitals Group     BP 05/29/24 1444 (!) 148/83     Girls Systolic BP Percentile --      Girls Diastolic BP Percentile --      Boys Systolic BP Percentile --      Boys Diastolic BP Percentile --      Pulse Rate 05/29/24 1444 79     Resp 05/29/24 1444 20     Temp 05/29/24 1444 98.3 F (36.8 C)     Temp src --      SpO2 05/29/24 1444 95 %     Weight --      Height --  Head Circumference --      Peak Flow --      Pain Score 05/29/24 1446 0     Pain Loc --      Pain Education --      Exclude from Growth Chart --    No data found.  Updated Vital Signs BP (!) 148/83 (BP Location: Right Arm)   Pulse 79   Temp 98.3 F (36.8 C)   Resp 20   SpO2 95%   Visual Acuity Right Eye Distance:   Left Eye Distance:   Bilateral Distance:    Right Eye Near:   Left Eye Near:    Bilateral Near:     Physical Exam Constitutional:      General: She is not in acute distress.    Appearance: Normal appearance. She is not ill-appearing, toxic-appearing or diaphoretic.  HENT:     Head: Normocephalic and atraumatic.     Right Ear: Tympanic membrane and ear canal normal.     Left Ear: Tympanic membrane and ear canal normal.     Nose: Congestion present.     Mouth/Throat:     Pharynx: Oropharynx is clear.  Eyes:     Conjunctiva/sclera: Conjunctivae normal.  Cardiovascular:     Rate and Rhythm: Normal rate and regular rhythm.     Pulses: Normal pulses.     Heart sounds: Normal heart sounds.  Pulmonary:     Effort: Pulmonary effort is normal.     Breath sounds: Normal breath sounds.  Skin:    General: Skin is warm and dry.  Neurological:     Mental Status: She is alert.  Psychiatric:        Mood and Affect: Mood normal.      UC Treatments / Results  Labs (all labs ordered are listed, but only abnormal results are displayed) Labs Reviewed  POCT INFLUENZA A/B - Normal    EKG   Radiology No results  found.  Procedures Procedures (including critical care time)  Medications Ordered in UC Medications - No data to display  Initial Impression / Assessment and Plan / UC Course  I have reviewed the triage vital signs and the nursing notes.  Pertinent labs & imaging results that were available during my care of the patient were reviewed by me and considered in my medical decision making (see chart for details).     Acute sinusitis- Treating for a sinus infection based on exam. Take the antibiotics as prescribed. OTC mucinex for congestion and cough,  Continue allergy medication.  Final Clinical Impressions(s) / UC Diagnoses   Final diagnoses:  Acute non-recurrent sinusitis, unspecified location     Discharge Instructions      Treating you for a sinus infection. Take the antibiotics as prescribed. OTC mucinex for congestion and cough,  Continue allergy medication.     ED Prescriptions     Medication Sig Dispense Auth. Provider   amoxicillin  (AMOXIL ) 875 MG tablet Take 1 tablet (875 mg total) by mouth 2 (two) times daily for 7 days. 14 tablet Adah Wilbert LABOR, FNP      PDMP not reviewed this encounter.     [1]  Social History Tobacco Use   Smoking status: Never    Passive exposure: Never   Smokeless tobacco: Never  Vaping Use   Vaping status: Never Used  Substance Use Topics   Alcohol use: No   Drug use: No     Adah Wilbert LABOR, FNP 05/30/24 [phone]  "

## 2024-05-29 NOTE — Discharge Instructions (Addendum)
 Treating you for a sinus infection. Take the antibiotics as prescribed. OTC mucinex for congestion and cough,  Continue allergy medication.

## 2024-05-29 NOTE — ED Triage Notes (Signed)
 Cough, ears itching, nasal drainage, sore throat x 3-4 days. Taking otc cold and flu.

## 2024-06-04 ENCOUNTER — Other Ambulatory Visit (HOSPITAL_BASED_OUTPATIENT_CLINIC_OR_DEPARTMENT_OTHER): Payer: Self-pay

## 2024-06-04 ENCOUNTER — Other Ambulatory Visit (HOSPITAL_BASED_OUTPATIENT_CLINIC_OR_DEPARTMENT_OTHER): Payer: Self-pay | Admitting: Student

## 2024-06-04 DIAGNOSIS — E782 Mixed hyperlipidemia: Secondary | ICD-10-CM

## 2024-06-04 DIAGNOSIS — I10 Essential (primary) hypertension: Secondary | ICD-10-CM

## 2024-06-04 DIAGNOSIS — E119 Type 2 diabetes mellitus without complications: Secondary | ICD-10-CM

## 2024-06-04 MED ORDER — HYDROCHLOROTHIAZIDE 12.5 MG PO CAPS
12.5000 mg | ORAL_CAPSULE | Freq: Every day | ORAL | 1 refills | Status: AC
  Filled 2024-06-04 – 2024-06-27 (×3): qty 90, 90d supply, fill #0

## 2024-06-04 MED ORDER — APIXABAN 5 MG PO TABS
5.0000 mg | ORAL_TABLET | Freq: Two times a day (BID) | ORAL | 1 refills | Status: AC
  Filled 2024-06-04 – 2024-06-13 (×2): qty 180, 90d supply, fill #0

## 2024-06-04 MED ORDER — ROSUVASTATIN CALCIUM 20 MG PO TABS
20.0000 mg | ORAL_TABLET | Freq: Every day | ORAL | 1 refills | Status: AC
  Filled 2024-06-04 – 2024-06-27 (×3): qty 90, 90d supply, fill #0

## 2024-06-04 MED ORDER — MOUNJARO 12.5 MG/0.5ML ~~LOC~~ SOAJ
12.5000 mg | SUBCUTANEOUS | 1 refills | Status: AC
  Filled 2024-06-04 – 2024-06-25 (×8): qty 6, 84d supply, fill #0

## 2024-06-10 ENCOUNTER — Other Ambulatory Visit (HOSPITAL_BASED_OUTPATIENT_CLINIC_OR_DEPARTMENT_OTHER): Payer: Self-pay

## 2024-06-15 ENCOUNTER — Other Ambulatory Visit (HOSPITAL_BASED_OUTPATIENT_CLINIC_OR_DEPARTMENT_OTHER): Payer: Self-pay

## 2024-06-17 ENCOUNTER — Telehealth: Admitting: Physician Assistant

## 2024-06-17 ENCOUNTER — Other Ambulatory Visit (HOSPITAL_BASED_OUTPATIENT_CLINIC_OR_DEPARTMENT_OTHER): Payer: Self-pay

## 2024-06-17 DIAGNOSIS — J019 Acute sinusitis, unspecified: Secondary | ICD-10-CM | POA: Diagnosis not present

## 2024-06-17 DIAGNOSIS — B9689 Other specified bacterial agents as the cause of diseases classified elsewhere: Secondary | ICD-10-CM

## 2024-06-17 DIAGNOSIS — H1031 Unspecified acute conjunctivitis, right eye: Secondary | ICD-10-CM

## 2024-06-17 MED ORDER — AMOXICILLIN-POT CLAVULANATE 875-125 MG PO TABS
1.0000 | ORAL_TABLET | Freq: Two times a day (BID) | ORAL | 0 refills | Status: AC
  Filled 2024-06-17: qty 14, 7d supply, fill #0

## 2024-06-17 MED ORDER — POLYMYXIN B-TRIMETHOPRIM 10000-0.1 UNIT/ML-% OP SOLN
OPHTHALMIC | 0 refills | Status: AC
  Filled 2024-06-17: qty 10, 12d supply, fill #0

## 2024-06-17 MED ORDER — FLUTICASONE PROPIONATE 50 MCG/ACT NA SUSP
2.0000 | Freq: Every day | NASAL | 0 refills | Status: AC
  Filled 2024-06-17: qty 16, 30d supply, fill #0

## 2024-06-17 NOTE — Progress Notes (Signed)
"      E-Visit for Sinus Problems  We are sorry that you are not feeling well.  Here is how we plan to help!  Based on what you have shared with me it looks like you have sinusitis.  Sinusitis is inflammation and infection in the sinus cavities of the head.  Based on your presentation I believe you most likely have Acute Bacterial Sinusitis.  This is an infection caused by bacteria and is treated with antibiotics. I have prescribed Augmentin  875mg /125mg  one tablet twice daily with food, for 7 days. and I have also prescribed Flonase  Nasal Spray Use 2 sprays in each nostril daily for 10-14 days You may use an oral decongestant such as Mucinex D or if you have glaucoma or high blood pressure use plain Mucinex. Saline nasal spray help and can safely be used as often as needed for congestion.  If you develop worsening sinus pain, fever or notice severe headache and vision changes, or if symptoms are not better after completion of antibiotic, please schedule an appointment with a health care provider.    Sinus infections are not as easily transmitted as other respiratory infection, however we still recommend that you avoid close contact with loved ones, especially the very young and elderly.  Remember to wash your hands thoroughly throughout the day as this is the number one way to prevent the spread of infection!  Use aquaphor or vaseline on flaky skin on ears.   Home Care: Only take medications as instructed by your medical team. Complete the entire course of an antibiotic. Do not take these medications with alcohol. A steam or ultrasonic humidifier can help congestion.  You can place a towel over your head and breathe in the steam from hot water coming from a faucet. Avoid close contacts especially the very young and the elderly. Cover your mouth when you cough or sneeze. Always remember to wash your hands.  Get Help Right Away If: You develop worsening fever or sinus pain. You develop a severe head  ache or visual changes. Your symptoms persist after you have completed your treatment plan.  Make sure you Understand these instructions. Will watch your condition. Will get help right away if you are not doing well or get worse.  Your e-visit answers were reviewed by a board certified advanced clinical practitioner to complete your personal care plan.  Depending on the condition, your plan could have included both over the counter or prescription medications.  If there is a problem please reply  once you have received a response from your provider.  Your safety is important to us .  If you have drug allergies check your prescription carefully.    You can use MyChart to ask questions about todays visit, request a non-urgent call back, or ask for a work or school excuse for 24 hours related to this e-Visit. If it has been greater than 24 hours you will need to follow up with your provider, or enter a new e-Visit to address those concerns.  You will get an e-mail in the next two days asking about your experience.  I hope that your e-visit has been valuable and will speed your recovery. Thank you for using e-visits.  I have spent 5 minutes in review of e-visit questionnaire, review and updating patient chart, medical decision making and response to patient.   Delon CHRISTELLA Dickinson, PA-C     "

## 2024-06-17 NOTE — Progress Notes (Signed)
E-Visit for Pink Eye   We are sorry that you are not feeling well.  Here is how we plan to help!  Based on what you have shared with me it looks like you have conjunctivitis.  Conjunctivitis is a common inflammatory or infectious condition of the eye that is often referred to as "pink eye".  In most cases it is contagious (viral or bacterial). However, not all conjunctivitis requires antibiotics (ex. Allergic).  We have made appropriate suggestions for you based upon your presentation.  I have prescribed Polytrim Ophthalmic drops 1-2 drops 4 times a day times 5 days  Pink eye can be highly contagious.  It is typically spread through direct contact with secretions, or contaminated objects or surfaces that one may have touched.  Strict handwashing is suggested with soap and water is urged.  If not available, use alcohol based had sanitizer.  Avoid unnecessary touching of the eye.  If you wear contact lenses, you will need to refrain from wearing them until you see no white discharge from the eye for at least 24 hours after being on medication.  You should see symptom improvement in 1-2 days after starting the medication regimen.  Call us if symptoms are not improved in 1-2 days.  Home Care: Wash your hands often! Do not wear your contacts until you complete your treatment plan. Avoid sharing towels, bed linen, personal items with a person who has pink eye. See attention for anyone in your home with similar symptoms.  Get Help Right Away If: Your symptoms do not improve. You develop blurred or loss of vision. Your symptoms worsen (increased discharge, pain or redness)   Thank you for choosing an e-visit.  Your e-visit answers were reviewed by a board certified advanced clinical practitioner to complete your personal care plan. Depending upon the condition, your plan could have included both over the counter or prescription medications.  Please review your pharmacy choice. Make sure the  pharmacy is open so you can pick up prescription now. If there is a problem, you may contact your provider through MyChart messaging and have the prescription routed to another pharmacy.  Your safety is important to us. If you have drug allergies check your prescription carefully.   For the next 24 hours you can use MyChart to ask questions about today's visit, request a non-urgent call back, or ask for a work or school excuse. You will get an email in the next two days asking about your experience. I hope that your e-visit has been valuable and will speed your recovery.   I have spent 5 minutes in review of e-visit questionnaire, review and updating patient chart, medical decision making and response to patient.   Jordanny Waddington, PhD, FNP-BC   

## 2024-06-18 ENCOUNTER — Other Ambulatory Visit (HOSPITAL_BASED_OUTPATIENT_CLINIC_OR_DEPARTMENT_OTHER): Payer: Self-pay

## 2024-06-19 NOTE — Progress Notes (Signed)
 Elaine Blair                                          MRN: [id number]   06/19/2024   The [facility] Quality Team Specialist reviewed this patient medical record for the purposes of chart review for care gap closure. The following were reviewed: chart review for care gap closure-glycemic status assessment.    [facility] Quality Team

## 2024-06-23 ENCOUNTER — Other Ambulatory Visit (HOSPITAL_COMMUNITY): Payer: Self-pay

## 2024-06-25 ENCOUNTER — Other Ambulatory Visit (HOSPITAL_BASED_OUTPATIENT_CLINIC_OR_DEPARTMENT_OTHER): Payer: Self-pay

## 2024-06-27 ENCOUNTER — Other Ambulatory Visit (HOSPITAL_BASED_OUTPATIENT_CLINIC_OR_DEPARTMENT_OTHER): Payer: Self-pay | Admitting: Student

## 2024-06-27 ENCOUNTER — Other Ambulatory Visit (HOSPITAL_COMMUNITY): Payer: Self-pay

## 2024-06-27 DIAGNOSIS — J302 Other seasonal allergic rhinitis: Secondary | ICD-10-CM

## 2024-06-29 ENCOUNTER — Other Ambulatory Visit (HOSPITAL_COMMUNITY): Payer: Self-pay

## 2024-06-29 MED ORDER — LEVOCETIRIZINE DIHYDROCHLORIDE 5 MG PO TABS
5.0000 mg | ORAL_TABLET | Freq: Every evening | ORAL | 2 refills | Status: AC
  Filled 2024-06-29: qty 30, 30d supply, fill #0

## 2024-07-01 ENCOUNTER — Other Ambulatory Visit (HOSPITAL_COMMUNITY): Payer: Self-pay

## 2024-08-12 ENCOUNTER — Ambulatory Visit (HOSPITAL_BASED_OUTPATIENT_CLINIC_OR_DEPARTMENT_OTHER): Admitting: Student
